# Patient Record
Sex: Male | Born: 1945 | Hispanic: Yes | Marital: Married | Smoking: Never smoker
Health system: Southern US, Community
[De-identification: ages and names within clinical notes are randomized; demographics above are authoritative.]

## PROBLEM LIST (undated history)

## (undated) DIAGNOSIS — I10 Essential (primary) hypertension: Secondary | ICD-10-CM

---

## 2021-02-20 LAB — HEPATIC FUNCTION PANEL
ALT: 12 U/L (ref 10–40)
AST: 15 (ref 14–40)
Alkaline Phosphatase: 49 (ref 25–125)

## 2021-02-20 LAB — BASIC METABOLIC PANEL
BUN: 14 (ref 4–21)
Chloride: 107 (ref 99–108)
Creatinine: 1.1 (ref 0.6–1.3)
Glucose: 122
Potassium: 5.1 mEq/L (ref 3.5–5.1)
Sodium: 145 (ref 137–147)

## 2021-02-20 LAB — LIPID PANEL
Cholesterol: 171 (ref 0–200)
HDL: 45 (ref 35–70)
LDL Cholesterol: 96

## 2021-02-20 LAB — CBC AND DIFFERENTIAL
HCT: 45 (ref 41–53)
Hemoglobin: 13.3 — AB (ref 13.5–17.5)
Platelets: 319 10*3/uL (ref 150–400)
WBC: 8.6

## 2021-02-20 LAB — COMPREHENSIVE METABOLIC PANEL: Calcium: 9.4 (ref 8.7–10.7)

## 2021-02-20 LAB — CBC: RBC: 5.06 (ref 3.87–5.11)

## 2021-02-20 LAB — MICROALBUMIN, URINE: Microalb, Ur: 2.3

## 2021-02-20 LAB — VITAMIN D 25 HYDROXY (VIT D DEFICIENCY, FRACTURES): Vit D, 25-Hydroxy: 57.1

## 2021-06-10 ENCOUNTER — Encounter: Payer: Self-pay | Admitting: Emergency Medicine

## 2021-06-10 ENCOUNTER — Other Ambulatory Visit: Payer: Self-pay

## 2021-06-10 ENCOUNTER — Ambulatory Visit
Admission: EM | Admit: 2021-06-10 | Discharge: 2021-06-10 | Disposition: A | Discharge status: Home / Self Care | Payer: Medicare Other

## 2021-06-10 DIAGNOSIS — S61211A Laceration without foreign body of left index finger without damage to nail, initial encounter: Secondary | ICD-10-CM | POA: Diagnosis not present

## 2021-06-10 DIAGNOSIS — Z23 Encounter for immunization: Secondary | ICD-10-CM

## 2021-06-10 HISTORY — DX: Essential (primary) hypertension: I10

## 2021-06-10 MED ORDER — TETANUS-DIPHTH-ACELL PERTUSSIS 5-2.5-18.5 LF-MCG/0.5 IM SUSY
0.5000 mL | PREFILLED_SYRINGE | Freq: Once | INTRAMUSCULAR | Status: AC
  Administered 2021-06-10: 0.5 mL via INTRAMUSCULAR

## 2021-06-10 MED ORDER — CEPHALEXIN 500 MG PO CAPS: 500.0000 mg | ORAL_CAPSULE | Freq: Three times a day (TID) | ORAL | 0 refills | Status: AC

## 2021-06-10 NOTE — ED Provider Notes (Signed)
[facility]    CSN: [id number] Arrival date & time: 06/10/21  1811      History   Chief Complaint Chief Complaint  Patient presents with   Laceration    HPI Noah Ayala is a 75 y.o. male.   HPI  75 year old male here for evaluation of left finger laceration.  Patient reports that he was preparing dinner and the knife slipped causing him to lacerate his left index finger.  He states he was going to put a bandage on it but did not have one at the house and his family told him he would probably need sutures.  The bleeding is well controlled with pressure and patient has the wound wrapped in a paper towel.  Patient denies numbness or tingling of his finger and he has full movement.  Past Medical History:  Diagnosis Date   Hypertension     There are no problems to display for this patient.   History reviewed. No pertinent surgical history.     Home Medications    Prior to Admission medications   Medication Sig Start Date End Date Taking? Authorizing Provider  cephALEXin (KEFLEX) 500 MG capsule Take 1 capsule (500 mg total) by mouth 3 (three) times daily for 7 days. 06/10/21 06/17/21 Yes Becky Augusta, NP  losartan (COZAAR) 50 MG tablet Take 50 mg by mouth daily. 01/18/21  Yes [provider]  simvastatin (ZOCOR) 40 MG tablet Take 40 mg by mouth at bedtime. 01/25/21  Yes [provider]    Family History No family history on file.  Social History Social History   Tobacco Use   Smoking status: Never   Smokeless tobacco: Never  Vaping Use   Vaping Use: Never used  Substance Use Topics   Alcohol use: Not Currently   Drug use: Not Currently     Allergies   Patient has no known allergies.   Review of Systems Review of Systems  Constitutional:  Negative for activity change, appetite change and fever.  Skin:  Positive for wound.  Neurological:  Negative for weakness and numbness.  Hematological: Negative.   Psychiatric/Behavioral:  Negative.      Physical Exam Triage Vital Signs ED Triage Vitals  Enc Vitals Group     BP      Pulse      Resp      Temp      Temp src      SpO2      Weight      Height      Head Circumference      Peak Flow      Pain Score      Pain Loc      Pain Edu?      Excl. in GC?    No data found.  Updated Vital Signs BP (!) 160/86 (BP Location: Left Arm)   Pulse (!) 58   Temp 98.3 F (36.8 C) (Oral)   Resp 18   Ht 5\' 8"  (1.727 m)   Wt 200 lb (90.7 kg)   SpO2 99%   BMI 30.41 kg/m   Visual Acuity Right Eye Distance:   Left Eye Distance:   Bilateral Distance:    Right Eye Near:   Left Eye Near:    Bilateral Near:     Physical Exam Vitals and nursing note reviewed.  Constitutional:      General: He is not in acute distress.    Appearance: Normal appearance. He is normal weight. He is  not ill-appearing.  HENT:     Head: Normocephalic and atraumatic.  Musculoskeletal:        General: Signs of injury present. No swelling, tenderness or deformity. Normal range of motion.  Skin:    General: Skin is warm and dry.     Capillary Refill: Capillary refill takes less than 2 seconds.     Findings: No bruising or erythema.  Neurological:     General: No focal deficit present.     Mental Status: He is alert and oriented to person, place, and time.     Sensory: No sensory deficit.     Motor: No weakness.  Psychiatric:        Mood and Affect: Mood normal.        Behavior: Behavior normal.        Thought Content: Thought content normal.        Judgment: Judgment normal.     UC Treatments / Results  Labs (all labs ordered are listed, but only abnormal results are displayed) Labs Reviewed - No data to display  EKG   Radiology No results found.  Procedures Laceration Repair  Date/Time: 06/10/2021 7:37 PM Performed by: Becky Augusta, NP Authorized by: Becky Augusta, NP   Consent:    Consent obtained:  Verbal   Consent given by:  Patient   Risks discussed:   Infection, pain and poor wound healing   Alternatives discussed:  No treatment Universal protocol:    Patient identity confirmed:  Verbally with patient Anesthesia:    Anesthesia method:  Local infiltration   Local anesthetic:  Lidocaine 1% w/o epi (3 mL) Laceration details:    Location:  Finger   Finger location:  L index finger   Length (cm):  3   Depth (mm):  5 Pre-procedure details:    Preparation:  Patient was prepped and draped in usual sterile fashion Exploration:    Limited defect created (wound extended): no     Hemostasis achieved with:  Direct pressure   Wound extent: areolar tissue violated     Wound extent: no fascia violation noted, no foreign bodies/material noted, no muscle damage noted, no nerve damage noted and no tendon damage noted     Contaminated: no   Treatment:    Area cleansed with:  Chlorhexidine   Amount of cleaning:  Standard   Debridement:  None   Undermining:  None   Scar revision: no   Skin repair:    Repair method:  Sutures   Suture size:  4-0   Suture material:  Prolene   Suture technique:  Simple interrupted   Number of sutures:  3 Approximation:    Approximation:  Close Repair type:    Repair type:  Simple Post-procedure details:    Dressing:  Antibiotic ointment and non-adherent dressing   Procedure completion:  Tolerated well, no immediate complications Comments:     3 cm laceration to middle phalanx of left index finger anesthetized using 3 mL of 1% lidocaine without epi.  The wound was explored through the steps for presence of foreign body or evidence of tendon or muscle damage.  The laceration does not extend past the subcutaneous layer.  Wound cleansed with chlorhexidine and saline, draped in a sterile fashion, and 3 simple interrupted 4-0 Prolene sutures were placed to close the wound.  Patient tolerated the procedure well.  The wound was again cleansed with saline before a dressing of bacitracin and nonadherent pad applied and  secured in place with Coban.  Patient tolerated procedure well. (including critical care time)  Medications Ordered in UC Medications  Tdap (BOOSTRIX) injection 0.5 mL (0.5 mLs Intramuscular Given 06/10/21 1935)    Initial Impression / Assessment and Plan / UC Course  I have reviewed the triage vital signs and the nursing notes.  Pertinent labs & imaging results that were available during my care of the patient were reviewed by me and considered in my medical decision making (see chart for details).  Patient is a very pleasant, nontoxic-appearing 75 year old male here for evaluation of a laceration to his left index finger.  He reports that he was cooking dinner and then the knife he was using slipped causing him to lacerate his finger.  Patient presents with a 3 cm laceration to the lateral aspect of the middle phalanx of the left index finger.  The bleeding is controlled with pressure.  The patient is full range of motion and sensation of his finger.  Wound explored and was found that extends into the subcutaneous layer but not down to the muscle or tendon layer.  Plan to close with sutures and discharge patient home on Keflex 500 mg 3 times daily for 7 days.  Suture removal in 10 days.  Final Clinical Impressions(s) / UC Diagnoses   Final diagnoses:  Laceration of left index finger without foreign body without damage to nail, initial encounter     Discharge Instructions      Leave the dressing in place for the next 24 hours.  After 24 hours remove the dressing, wash the wound with warm water and soap, pat it dry, and apply a thin smear of bacitracin.  Clean the wound daily and apply bacitracin for the first 2 days.  After that a scab should have started to form and you can leave the wound open to air when you are at home and cover with a Band-Aid when you go out in public.  Take the Keflex three times daily for 7 days for prevention of wound infection.  Sutures remain in place for 10  days, please return here or see your primary care provider for removal.  If you develop any redness at the wound site, swelling, pain, drainage, red streaks going up your hand, or fever please return for reevaluation.      ED Prescriptions     Medication Sig Dispense Auth. Provider   cephALEXin (KEFLEX) 500 MG capsule Take 1 capsule (500 mg total) by mouth 3 (three) times daily for 7 days. 21 capsule Becky Augusta, NP      PDMP not reviewed this encounter.   Becky Augusta, NP 06/10/21 1940

## 2021-06-10 NOTE — ED Triage Notes (Signed)
Pt has laceration on left index finger. He states he cut it cooking about an hour ago. Bleeding controlled.

## 2021-06-10 NOTE — Discharge Instructions (Addendum)
Leave the dressing in place for the next 24 hours.  After 24 hours remove the dressing, wash the wound with warm water and soap, pat it dry, and apply a thin smear of bacitracin.  Clean the wound daily and apply bacitracin for the first 2 days.  After that a scab should have started to form and you can leave the wound open to air when you are at home and cover with a Band-Aid when you go out in public.  Take the Keflex three times daily for 7 days for prevention of wound infection.  Sutures remain in place for 10 days, please return here or see your primary care provider for removal.  If you develop any redness at the wound site, swelling, pain, drainage, red streaks going up your hand, or fever please return for reevaluation.  

## 2021-12-12 ENCOUNTER — Ambulatory Visit: Payer: Medicare Other | Admitting: Urology

## 2021-12-18 ENCOUNTER — Other Ambulatory Visit: Payer: Self-pay

## 2021-12-18 ENCOUNTER — Encounter: Payer: Self-pay | Admitting: Urology

## 2021-12-18 ENCOUNTER — Ambulatory Visit (INDEPENDENT_AMBULATORY_CARE_PROVIDER_SITE_OTHER): Payer: Medicare Other | Admitting: Urology

## 2021-12-18 VITALS — BP 123/76 | HR 82 | Ht 68.0 in | Wt 200.0 lb

## 2021-12-18 DIAGNOSIS — R972 Elevated prostate specific antigen [PSA]: Secondary | ICD-10-CM

## 2021-12-18 DIAGNOSIS — R310 Gross hematuria: Secondary | ICD-10-CM | POA: Diagnosis not present

## 2021-12-18 DIAGNOSIS — N400 Enlarged prostate without lower urinary tract symptoms: Secondary | ICD-10-CM | POA: Diagnosis not present

## 2021-12-18 LAB — MICROSCOPIC EXAMINATION: Bacteria, UA: NONE SEEN

## 2021-12-18 LAB — URINALYSIS, COMPLETE
Bilirubin, UA: NEGATIVE
Glucose, UA: NEGATIVE
Leukocytes,UA: NEGATIVE
Nitrite, UA: NEGATIVE
Protein,UA: NEGATIVE
RBC, UA: NEGATIVE
Specific Gravity, UA: >1.03 — ABNORMAL HIGH (ref 1.005–1.030)
Urobilinogen, Ur: 1 mg/dL (ref 0.2–1.0)
pH, UA: 6 (ref 5.0–7.5)

## 2021-12-18 LAB — BLADDER SCAN AMB NON-IMAGING: Scan Result: 14

## 2021-12-18 NOTE — Progress Notes (Signed)
? ?  12/18/2021 ?9:52 AM  ? ?Noah Ayala ?June 23, 1946 ?[id number] ? ?Referring provider: No referring provider defined for this encounter. ? ?Chief Complaint  ?Patient presents with  ? Hematuria  ? ? ?HPI: ?Noah Ayala is a 76 y.o. male who presents to establish local urologic care. ? ?Recently moved to the area from [place] ?3 weeks ago onset of total gross painless hematuria with significant clot ?Episode lasted a few days and resolved after he stopped taking Naprosyn ?Had no dysuria or flank/abdominal/pelvic pain ? ?Prior urologic records [facility] were reviewed: ? ?Was followed for BPH, elevated PSA and gross hematuria ?Gross hematuria evaluations 2014, 2017 and 2018 showed no significant abnormalities.  Bladder biopsy in 2017 which was negative ?Elevated PSA since 2015 has fluctuated from 3.6-6.0; benign prostate biopsy 2015 for PSA 5.9.  PSA April 2022 was 30 but on repeat back to baseline at 5.1 ?Has previously been on finasteride and alfuzosin but currently on no medications for BPH.  Presently without bothersome LUTS ? ? ?PMH: ?Past Medical History:  ?Diagnosis Date  ? Hypertension   ? ? ?Surgical History: ?History reviewed. No pertinent surgical history. ? ?Home Medications:  ?Allergies as of 12/18/2021   ?No Known Allergies ?  ? ?  ?Medication List  ?  ? ?  ? Accurate as of December 18, 2021  9:52 AM. If you have any questions, ask your nurse or doctor.  ?  ?  ? ?  ? ?losartan 50 MG tablet ?Commonly known as: COZAAR ?Take 50 mg by mouth daily. ?  ?simvastatin 40 MG tablet ?Commonly known as: ZOCOR ?Take 40 mg by mouth at bedtime. ?  ? ?  ? ? ?Allergies: No Known Allergies ? ?Family History: ?History reviewed. No pertinent family history. ? ?Social History:  reports that he has never smoked. He has never used smokeless tobacco. He reports that he does not currently use alcohol. He reports that he does not currently use drugs. ? ? ?Physical Exam: ?BP 123/76   Pulse 82   Ht 5\' 8"  (1.727 m)   Wt 200 lb (90.7 kg)    BMI 30.41 kg/m?   ?Constitutional:  Alert and oriented, No acute distress. ?HEENT: [place] AT, moist mucus membranes.  Trachea midline, no masses. ?Cardiovascular: No clubbing, cyanosis, or edema. ?Respiratory: Normal respiratory effort, no increased work of breathing. ?GI: Abdomen is soft, nontender, nondistended, no abdominal masses ?GU: Prostate 50 g, smooth without nodules ?Skin: No rashes, bruises or suspicious lesions. ?Neurologic: Grossly intact, no focal deficits, moving all 4 extremities. ?Psychiatric: Normal mood and affect. ? ?Laboratory Data: ? ?Urinalysis ?Microscopy negative WBC/RBC; + calcium oxalate crystals ? ? ? ?Assessment & Plan:   ? ?1. Gross hematuria ?Last evaluation in 2018 ?Recent episode of significant gross hematuria with clots ?Recommend reevaluation with CT urogram/cystoscopy ?Creatinine drawn ? ?2.  Elevated PSA ?PSA drawn today ? ?3.  BPH with LUTS ?Stable ? ? ?2019, MD ? ?[facility] ?[address] ?[place], [place] ?[phone] ? ?

## 2021-12-19 LAB — CREATININE, SERUM
Creatinine, Ser: 0.95 mg/dL (ref 0.76–1.27)
eGFR: 83 mL/min/{1.73_m2} (ref >59)

## 2021-12-19 LAB — PSA: Prostate Specific Ag, Serum: 6.2 ng/mL — ABNORMAL HIGH (ref 0.0–4.0)

## 2021-12-20 ENCOUNTER — Telehealth: Payer: Self-pay | Admitting: Family Medicine

## 2021-12-20 NOTE — Telephone Encounter (Signed)
LMOM informed patient of lab result.  

## 2021-12-20 NOTE — Telephone Encounter (Signed)
-----   Message from Abbie Sons, MD sent at 12/19/2021  8:13 PM EDT ----- ?PSA stable at 6.2 ?

## 2021-12-22 ENCOUNTER — Encounter: Payer: Self-pay | Admitting: Urology

## 2022-01-01 ENCOUNTER — Ambulatory Visit
Admission: RE | Admit: 2022-01-01 | Discharge: 2022-01-01 | Disposition: A | Discharge status: Home / Self Care | Payer: Medicare Other | Source: Ambulatory Visit | Attending: Urology | Admitting: Urology

## 2022-01-01 DIAGNOSIS — R310 Gross hematuria: Secondary | ICD-10-CM | POA: Diagnosis present

## 2022-01-01 MED ORDER — IOHEXOL 300 MG/ML  SOLN
100.0000 mL | Freq: Once | INTRAMUSCULAR | Status: AC | PRN
  Administered 2022-01-01: 100 mL via INTRAVENOUS

## 2022-01-06 ENCOUNTER — Encounter: Payer: Self-pay | Admitting: *Deleted

## 2022-01-22 ENCOUNTER — Ambulatory Visit (INDEPENDENT_AMBULATORY_CARE_PROVIDER_SITE_OTHER): Payer: Medicare Other | Admitting: Urology

## 2022-01-22 ENCOUNTER — Encounter: Payer: Self-pay | Admitting: Urology

## 2022-01-22 VITALS — BP 145/81 | HR 74 | Ht 68.0 in | Wt 200.0 lb

## 2022-01-22 DIAGNOSIS — N200 Calculus of kidney: Secondary | ICD-10-CM | POA: Diagnosis not present

## 2022-01-22 DIAGNOSIS — N400 Enlarged prostate without lower urinary tract symptoms: Secondary | ICD-10-CM

## 2022-01-22 DIAGNOSIS — R310 Gross hematuria: Secondary | ICD-10-CM | POA: Diagnosis not present

## 2022-01-22 LAB — URINALYSIS, COMPLETE
Bilirubin, UA: NEGATIVE
Glucose, UA: NEGATIVE
Ketones, UA: NEGATIVE
Leukocytes,UA: NEGATIVE
Nitrite, UA: NEGATIVE
Protein,UA: NEGATIVE
RBC, UA: NEGATIVE
Specific Gravity, UA: 1.025 (ref 1.005–1.030)
Urobilinogen, Ur: 0.2 mg/dL (ref 0.2–1.0)
pH, UA: 6.5 (ref 5.0–7.5)

## 2022-01-22 LAB — MICROSCOPIC EXAMINATION: Bacteria, UA: NONE SEEN

## 2022-01-22 MED ORDER — FINASTERIDE 5 MG PO TABS: 5.0000 mg | ORAL_TABLET | Freq: Every day | ORAL | 3 refills | Status: DC

## 2022-01-22 MED ORDER — SILDENAFIL CITRATE 20 MG PO TABS: ORAL_TABLET | ORAL | 3 refills | Status: DC

## 2022-01-22 NOTE — Progress Notes (Signed)
? ?  01/22/22 ? ?CC:  ?Chief Complaint  ?Patient presents with  ? Cysto  ? ?Indications: Gross hematuria ? ? ?HPI: Has noted minimal amounts of gross hematuria since last visit.  CTU showed a 5 mm nonobstructing left renal calculus.  Prostate volume 124 cc ? ?Blood pressure (!) 145/81, pulse 74, height 5\' 8"  (1.727 m), weight 200 lb (90.7 kg). ?NED. A&Ox3.   ?No respiratory distress   ?Abd soft, NT, ND ?Normal phallus with bilateral descended testicles ? ?Cystoscopy Procedure Note ? ?Patient identification was confirmed, informed consent was obtained, and patient was prepped using Betadine solution.  Lidocaine jelly was administered per urethral meatus.   ? ? ?Pre-Procedure: ?- Inspection reveals a normal caliber urethral meatus. ? ?Procedure: ?The flexible cystoscope was introduced without difficulty ?- No urethral strictures/lesions are present. ?-  Coapting lateral lobes with prominent hypervascularity/friability   ?- Elevated bladder neck ?- Bilateral ureteral orifices identified ?- Bladder mucosa  reveals no ulcers, tumors, or lesions ?- No bladder stones ?-Moderate trabeculation ? ?Retroflexion shows backbleeding from prostate.  No intravesical median lobe or lesions ? ? ?Post-Procedure: ?- Patient tolerated the procedure well ? ?Assessment/ Plan: ?Marked BPH with prostate volume >100 cc ?Hematuria most likely prostatic in origin ?Start finasteride 5 mg daily-potential side effects were discussed including low incidence ED, decreased semen volume and finasteride syndrome ?Nonobstructing left renal calculus ?6 month follow-up visit ? ? ? ? , MD ? ?

## 2022-01-23 LAB — CYTOLOGY - NON PAP

## 2022-01-24 ENCOUNTER — Encounter: Payer: Self-pay | Admitting: *Deleted

## 2022-02-16 ENCOUNTER — Other Ambulatory Visit: Payer: Self-pay | Admitting: Urology

## 2022-04-03 ENCOUNTER — Encounter: Payer: Self-pay | Admitting: Family Medicine

## 2022-04-03 ENCOUNTER — Ambulatory Visit (INDEPENDENT_AMBULATORY_CARE_PROVIDER_SITE_OTHER): Payer: Medicare Other | Admitting: Family Medicine

## 2022-04-03 VITALS — BP 130/80 | HR 72 | Ht 68.0 in | Wt 207.0 lb

## 2022-04-03 DIAGNOSIS — I10 Essential (primary) hypertension: Secondary | ICD-10-CM

## 2022-04-03 DIAGNOSIS — R0789 Other chest pain: Secondary | ICD-10-CM

## 2022-04-03 DIAGNOSIS — E785 Hyperlipidemia, unspecified: Secondary | ICD-10-CM

## 2022-04-03 DIAGNOSIS — R21 Rash and other nonspecific skin eruption: Secondary | ICD-10-CM

## 2022-04-03 MED ORDER — SIMVASTATIN 40 MG PO TABS: 40.0000 mg | ORAL_TABLET | Freq: Every day | ORAL | 1 refills | Status: DC

## 2022-04-03 MED ORDER — LOSARTAN POTASSIUM 50 MG PO TABS: 50.0000 mg | ORAL_TABLET | Freq: Every day | ORAL | 1 refills | Status: DC

## 2022-04-03 NOTE — Progress Notes (Addendum)
Date:  04/03/2022   Name:  Noah Ayala   DOB:  [birth year]   MRN:  [id number]   Chief Complaint: Establish Care and eye redness (Skin changes- brown, irregular circles on R) shoulder and across chest)  Patient is a 76 year old male who presents for a establish care exam. The patient reports the following problems: hypertension/hyperlipidemia. Health maintenance has been reviewed up to date.    Hypertension This is a chronic problem. The current episode started more than 1 year ago. The problem has been gradually improving since onset. The problem is controlled. Pertinent negatives include no anxiety, blurred vision, chest pain, headaches, malaise/fatigue, neck pain, orthopnea, palpitations, peripheral edema, PND, shortness of breath or sweats. There are no associated agents to hypertension. There are no known risk factors for coronary artery disease. Past treatments include angiotensin blockers. The current treatment provides moderate improvement. There are no compliance problems.  There is no history of angina, kidney disease, CAD/MI, CVA, heart failure, left ventricular hypertrophy, PVD or retinopathy. There is no history of chronic renal disease, a hypertension causing med or renovascular disease.  Hyperlipidemia This is a chronic problem. The current episode started more than 1 year ago. The problem is controlled. Recent lipid tests were reviewed and are normal. He has no history of chronic renal disease. Pertinent negatives include no chest pain, myalgias or shortness of breath. Current antihyperlipidemic treatment includes statins.    Lab Results  Component Value Date   CREATININE 0.95 12/18/2021   EGFR 83 12/18/2021   No results found for: "CHOL", "HDL", "LDLCALC", "LDLDIRECT", "TRIG", "CHOLHDL" No results found for: "TSH" No results found for: "HGBA1C" No results found for: "WBC", "HGB", "HCT", "MCV", "PLT" No results found for: "ALT", "AST", "GGT", "ALKPHOS", "BILITOT" No  results found for: "25OHVITD2", "25OHVITD3", "VD25OH"   Review of Systems  Constitutional:  Negative for chills, fever and malaise/fatigue.  HENT:  Negative for drooling, ear discharge, ear pain and sore throat.   Eyes:  Negative for blurred vision.  Respiratory:  Negative for cough, shortness of breath and wheezing.   Cardiovascular:  Negative for chest pain, palpitations, orthopnea, leg swelling and PND.  Gastrointestinal:  Negative for abdominal pain, blood in stool, constipation, diarrhea and nausea.  Endocrine: Negative for polydipsia.  Genitourinary:  Negative for dysuria, frequency, hematuria and urgency.  Musculoskeletal:  Negative for back pain, myalgias and neck pain.  Skin:  Negative for rash.  Allergic/Immunologic: Negative for environmental allergies.  Neurological:  Negative for dizziness and headaches.  Hematological:  Does not bruise/bleed easily.  Psychiatric/Behavioral:  Negative for suicidal ideas. The patient is not nervous/anxious.     There are no problems to display for this patient.   No Known Allergies  No past surgical history on file.  Social History   Tobacco Use   Smoking status: Never   Smokeless tobacco: Never  Vaping Use   Vaping Use: Never used  Substance Use Topics   Alcohol use: Not Currently   Drug use: Not Currently     Medication list has been reviewed and updated.  Current Meds  Medication Sig   finasteride (PROSCAR) 5 MG tablet TAKE 1 TABLET (5 MG TOTAL) BY MOUTH DAILY.   losartan (COZAAR) 50 MG tablet Take 50 mg by mouth daily.   simvastatin (ZOCOR) 40 MG tablet Take 40 mg by mouth at bedtime. 1/2 tablet daily       04/03/2022    3:40 PM  GAD 7 : Generalized Anxiety Score  Nervous,  Anxious, on Edge 1  Control/stop worrying 0  Worry too much - different things 1  Trouble relaxing 0  Restless 0  Easily annoyed or irritable 1  Afraid - awful might happen 0  Total GAD 7 Score 3  Anxiety Difficulty Not difficult at all        04/03/2022    3:40 PM  Depression screen PHQ 2/9  Decreased Interest 0  Down, Depressed, Hopeless 0  PHQ - 2 Score 0  Altered sleeping 0  Tired, decreased energy 1  Change in appetite 0  Feeling bad or failure about yourself  0  Trouble concentrating 1  Moving slowly or fidgety/restless 0  Suicidal thoughts 0  PHQ-9 Score 2  Difficult doing work/chores Not difficult at all    BP Readings from Last 3 Encounters:  04/03/22 130/80  01/22/22 (!) 145/81  12/18/21 123/76    Physical Exam Vitals and nursing note reviewed.  HENT:     Head: Normocephalic.     Right Ear: Tympanic membrane and external ear normal.     Left Ear: Tympanic membrane and external ear normal.     Nose: Nose normal.     Mouth/Throat:     Mouth: Mucous membranes are moist.     Pharynx: No oropharyngeal exudate or posterior oropharyngeal erythema.  Eyes:     General: No scleral icterus.       Right eye: No discharge.        Left eye: No discharge.     Conjunctiva/sclera: Conjunctivae normal.  Neck:     Thyroid: No thyromegaly.     Vascular: No JVD.     Trachea: No tracheal deviation.  Cardiovascular:     Rate and Rhythm: Normal rate and regular rhythm.     Heart sounds: Normal heart sounds. No murmur heard.    No friction rub. No gallop.  Pulmonary:     Effort: No respiratory distress.     Breath sounds: Normal breath sounds. No wheezing, rhonchi or rales.  Chest:     Chest wall: No tenderness.  Abdominal:     General: Bowel sounds are normal.     Palpations: Abdomen is soft. There is no mass.     Tenderness: There is no abdominal tenderness. There is no guarding or rebound.  Musculoskeletal:        General: No tenderness. Normal range of motion.     Cervical back: Normal range of motion and neck supple.  Lymphadenopathy:     Cervical: No cervical adenopathy.  Skin:    General: Skin is warm.     Findings: No rash.  Neurological:     Mental Status: He is alert.     Deep Tendon  Reflexes: Reflexes are normal and symmetric.     Wt Readings from Last 3 Encounters:  04/03/22 207 lb (93.9 kg)  01/22/22 200 lb (90.7 kg)  12/18/21 200 lb (90.7 kg)    BP 130/80   Pulse 72   Ht $R'5\' 8"'eG$  (1.727 m)   Wt 207 lb (93.9 kg)   BMI 31.47 kg/m   Assessment and Plan: 1. Primary hypertension Chronic.  Controlled.  Stable.  Continue losartan 50 mg once a day.  We will recheck in 6 months - losartan (COZAAR) 50 MG tablet; Take 1 tablet (50 mg total) by mouth daily.  Dispense: 90 tablet; Refill: 1  2. Hyperlipidemia, unspecified hyperlipidemia type Chronic.  Controlled.  Stable.  Continue simvastatin 40 mg nightly 1/2 tablet. - simvastatin (ZOCOR)  40 MG tablet; Take 1 tablet (40 mg total) by mouth at bedtime. 1/2 tablet daily  Dispense: 45 tablet; Refill: 1  3. Rash New onset.  Persistent.  Stable.  Patient has a rash that I cannot really appreciate it appears to be tinea versicolor but will refer to dermatology because he also has what looks like a seborrheic keratoses on the scalp. - Ambulatory referral to Dermatology  4. Nonexertional chest pain Relatively new onset over several months patient has been having substernal discomfort but of a nonexertional nature that we will occur when he is lying supine at rest.  Patient has been able to walk and there is no pain associated with this however we need to do further evaluation given his risk factors and age.  Patient will return next week and we will reevaluate this discomfort.  Patient has been instructed that if substernal chest pain should return if does not resolve he is to go to the emergency room for further evaluation.  Patient establishing care with new physician.  Had 2 new concerns along with ongoing chronic concerns of hypertension and hyperlipidemia.

## 2022-04-08 ENCOUNTER — Ambulatory Visit (INDEPENDENT_AMBULATORY_CARE_PROVIDER_SITE_OTHER): Payer: Medicare Other | Admitting: Family Medicine

## 2022-04-08 ENCOUNTER — Encounter: Payer: Self-pay | Admitting: Family Medicine

## 2022-04-08 ENCOUNTER — Telehealth: Payer: Self-pay | Admitting: Family Medicine

## 2022-04-08 VITALS — BP 130/76 | HR 60 | Ht 68.0 in | Wt 207.0 lb

## 2022-04-08 DIAGNOSIS — I10 Essential (primary) hypertension: Secondary | ICD-10-CM | POA: Diagnosis not present

## 2022-04-08 DIAGNOSIS — R0789 Other chest pain: Secondary | ICD-10-CM | POA: Diagnosis not present

## 2022-04-08 DIAGNOSIS — E785 Hyperlipidemia, unspecified: Secondary | ICD-10-CM | POA: Diagnosis not present

## 2022-04-08 NOTE — Progress Notes (Signed)
Date:  04/08/2022   Name:  Noah Ayala   DOB:  1945/12/06   MRN:  [id number]   Chief Complaint: Chest Pain (Nonexertional- has not had any episodes since before the last visit)  Chest Pain  This is a new problem. The current episode started more than 1 month ago. The onset quality is gradual. The problem occurs every several days. The pain is present in the substernal region. The pain is at a severity of 3/10. The pain is moderate. The quality of the pain is described as tightness. The pain does not radiate. Pertinent negatives include no abdominal pain, diaphoresis, dizziness, exertional chest pressure, near-syncope, palpitations or shortness of breath. The treatment provided no relief.  His past medical history is significant for hyperlipidemia and hypertension.  His family medical history is significant for CAD and heart disease.    Lab Results  Component Value Date   CREATININE 0.95 12/18/2021   EGFR 83 12/18/2021   No results found for: "CHOL", "HDL", "LDLCALC", "LDLDIRECT", "TRIG", "CHOLHDL" No results found for: "TSH" No results found for: "HGBA1C" No results found for: "WBC", "HGB", "HCT", "MCV", "PLT" No results found for: "ALT", "AST", "GGT", "ALKPHOS", "BILITOT" No results found for: "25OHVITD2", "25OHVITD3", "VD25OH"   Review of Systems  Constitutional:  Negative for diaphoresis.  HENT:  Negative for sinus pressure.   Respiratory:  Negative for shortness of breath.   Cardiovascular:  Positive for chest pain. Negative for palpitations and near-syncope.  Gastrointestinal:  Negative for abdominal pain.  Endocrine: Negative for polydipsia and polyuria.  Genitourinary:  Positive for frequency.  Neurological:  Negative for dizziness.    There are no problems to display for this patient.   No Known Allergies  No past surgical history on file.  Social History   Tobacco Use   Smoking status: Never   Smokeless tobacco: Never  Vaping Use   Vaping Use: Never used   Substance Use Topics   Alcohol use: Not Currently   Drug use: Not Currently     Medication list has been reviewed and updated.  Current Meds  Medication Sig   finasteride (PROSCAR) 5 MG tablet TAKE 1 TABLET (5 MG TOTAL) BY MOUTH DAILY.   losartan (COZAAR) 50 MG tablet Take 1 tablet (50 mg total) by mouth daily.   simvastatin (ZOCOR) 40 MG tablet Take 1 tablet (40 mg total) by mouth at bedtime. 1/2 tablet daily       04/08/2022    1:39 PM 04/03/2022    3:40 PM  GAD 7 : Generalized Anxiety Score  Nervous, Anxious, on Edge 0 1  Control/stop worrying 0 0  Worry too much - different things 0 1  Trouble relaxing 0 0  Restless 1 0  Easily annoyed or irritable 0 1  Afraid - awful might happen 0 0  Total GAD 7 Score 1 3  Anxiety Difficulty Not difficult at all Not difficult at all       04/08/2022    1:39 PM 04/03/2022    3:40 PM  Depression screen PHQ 2/9  Decreased Interest 0 0  Down, Depressed, Hopeless 0 0  PHQ - 2 Score 0 0  Altered sleeping 0 0  Tired, decreased energy 1 1  Change in appetite 0 0  Feeling bad or failure about yourself  0 0  Trouble concentrating 1 1  Moving slowly or fidgety/restless 0 0  Suicidal thoughts 0 0  PHQ-9 Score 2 2  Difficult doing work/chores Not difficult at all  Not difficult at all    BP Readings from Last 3 Encounters:  04/08/22 130/76  04/03/22 130/80  01/22/22 (!) 145/81    Physical Exam Vitals and nursing note reviewed.  HENT:     Head: Normocephalic.     Right Ear: External ear normal.     Left Ear: External ear normal.     Nose: Nose normal.  Eyes:     General: No scleral icterus.       Right eye: No discharge.        Left eye: No discharge.     Conjunctiva/sclera: Conjunctivae normal.     Pupils: Pupils are equal, round, and reactive to light.  Neck:     Thyroid: No thyromegaly.     Vascular: No JVD.     Trachea: No tracheal deviation.  Cardiovascular:     Rate and Rhythm: Regular rhythm. Bradycardia present.      Heart sounds: Normal heart sounds. No murmur heard.    No systolic murmur is present.     No diastolic murmur is present.     No friction rub. No gallop. No S3 or S4 sounds.  Pulmonary:     Effort: No respiratory distress.     Breath sounds: Normal breath sounds. No decreased breath sounds, wheezing, rhonchi or rales.  Abdominal:     General: Bowel sounds are normal.     Palpations: Abdomen is soft. There is no hepatomegaly, splenomegaly or mass.     Tenderness: There is no abdominal tenderness. There is no guarding or rebound.  Musculoskeletal:        General: No tenderness. Normal range of motion.     Cervical back: Normal range of motion and neck Ayala.  Lymphadenopathy:     Cervical: No cervical adenopathy.  Skin:    General: Skin is warm.     Findings: No rash.  Neurological:     Mental Status: He is alert and oriented to person, place, and time.     Cranial Nerves: No cranial nerve deficit.     Deep Tendon Reflexes: Reflexes are normal and symmetric.     Wt Readings from Last 3 Encounters:  04/08/22 207 lb (93.9 kg)  04/03/22 207 lb (93.9 kg)  01/22/22 200 lb (90.7 kg)    BP 130/76   Pulse 60   Ht _0  (1.727 m)   Wt 207 lb (93.9 kg)   BMI 31.47 kg/m   Assessment and Plan:  1. Nonexertional chest pain New onset.  Episodic.  Patient relates nonexertional chest pain but has multiple risk factors including hypertension hyperlipidemia and positive family history.  EKG performed and read as follows: Bradycardia with a rate of 57 sinus rhythm.  Intervals normal no voltage criteria met for for LVH.  No evidence of ischemic changes such as Q waves, ST-T wave changes, or delay in R wave progression.  Given the family history and a previous EKG that was done over 10 years ago in [place] we will refer to cardiology for evaluation with determination whether further investigation such as stress test or cardiac echo may need to be done. - EKG 12-Lead - Ambulatory referral  to Cardiology  2. Primary hypertension Chronic.  Controlled.  Stable.  Currently taking losartan 50 mg once a day.  We will check renal function panel for electrolytes and GFR. - Renal Function Panel - Ambulatory referral to Cardiology  3. Hyperlipidemia, unspecified hyperlipidemia type Chronic.  Controlled.  Stable.  Patient currently taking simvastatin 40  mg once a day we will check for lipid panel for current LDL control. - Lipid Panel With LDL/HDL Ratio - Ambulatory referral to Cardiology   Patient has been instructed if chest pain should resume and remain persistent or other symptoms develop that calling 911 would be in order.

## 2022-04-08 NOTE — Telephone Encounter (Signed)
Copied from CRM [id number]. Topic: Referral - Status >> Apr 08, 2022  3:01 PM Franchot Heidelberg wrote: Reason for CRM: Para March from [facility] dermatology called reporting that they have yet to receive the referral for this patient. Call back request   Best contact: [phone]

## 2022-04-08 NOTE — Telephone Encounter (Signed)
Spoke with Angelique Blonder she reached out to [facility] and resent the referral. Pt is scheduled for August 2 at 3:30.  KP

## 2022-04-08 NOTE — Telephone Encounter (Signed)
Sent message to [facility]. Waiting on response. Referral was placed 04/03/22.  KP

## 2022-04-09 LAB — LIPID PANEL WITH LDL/HDL RATIO
Cholesterol, Total: 178 mg/dL (ref 100–199)
HDL: 47 mg/dL (ref >39)
LDL Chol Calc (NIH): 103 mg/dL — ABNORMAL HIGH (ref 0–99)
LDL/HDL Ratio: 2.2 ratio (ref 0.0–3.6)
Triglycerides: 157 mg/dL — ABNORMAL HIGH (ref 0–149)
VLDL Cholesterol Cal: 28 mg/dL (ref 5–40)

## 2022-04-09 LAB — RENAL FUNCTION PANEL
Albumin: 4.4 g/dL (ref 3.8–4.8)
BUN/Creatinine Ratio: 15 (ref 10–24)
BUN: 13 mg/dL (ref 8–27)
CO2: 25 mmol/L (ref 20–29)
Calcium: 9.3 mg/dL (ref 8.6–10.2)
Chloride: 104 mmol/L (ref 96–106)
Creatinine, Ser: 0.87 mg/dL (ref 0.76–1.27)
Glucose: 91 mg/dL (ref 70–99)
Phosphorus: 3.6 mg/dL (ref 2.8–4.1)
Potassium: 4.8 mmol/L (ref 3.5–5.2)
Sodium: 142 mmol/L (ref 134–144)
eGFR: 89 mL/min/{1.73_m2} (ref >59)

## 2022-05-12 ENCOUNTER — Other Ambulatory Visit: Payer: Self-pay | Admitting: Family Medicine

## 2022-05-12 DIAGNOSIS — I10 Essential (primary) hypertension: Secondary | ICD-10-CM

## 2022-05-13 NOTE — Telephone Encounter (Signed)
Requested medication (s) are due for refill today: no   Requested medication (s) are on the active medication list: yes  Last refill:  04/03/22  Future visit scheduled:yes  Notes to clinic:  Unable to refill per protocol, last refill by provider 04/03/22 for 90 days and 1 RF, request is too soon.     Requested Prescriptions  Pending Prescriptions Disp Refills   losartan (COZAAR) 50 MG tablet [Pharmacy Med Name: LOSARTAN POTASSIUM 50 MG TAB] 90 tablet 1    Sig: TAKE 1 TABLET BY MOUTH 1 TIME EACH DAY.     Cardiovascular:  Angiotensin Receptor Blockers Passed - 05/12/2022  7:26 PM      Passed - Cr in normal range and within 180 days    Creatinine, Ser  Date Value Ref Range Status  04/08/2022 0.87 0.76 - 1.27 mg/dL Final         Passed - K in normal range and within 180 days    Potassium  Date Value Ref Range Status  04/08/2022 4.8 3.5 - 5.2 mmol/L Final         Passed - Patient is not pregnant      Passed - Last BP in normal range    BP Readings from Last 1 Encounters:  04/08/22 130/76         Passed - Valid encounter within last 6 months    Recent Outpatient Visits           1 month ago Nonexertional chest pain   [facility] [facility]   1 month ago Primary hypertension   [place] [facility]       Future Appointments             In 2 months Stoioff, Verna Czech, MD [facility]   In 3 months Duanne Limerick, MD [facility] at [facility], [facility]

## 2022-05-15 ENCOUNTER — Other Ambulatory Visit: Payer: Self-pay | Admitting: Family Medicine

## 2022-05-15 DIAGNOSIS — I10 Essential (primary) hypertension: Secondary | ICD-10-CM

## 2022-05-15 MED ORDER — LOSARTAN POTASSIUM 50 MG PO TABS: 50.0000 mg | ORAL_TABLET | Freq: Every day | ORAL | 0 refills | Status: DC

## 2022-05-15 NOTE — Telephone Encounter (Addendum)
Medication Refill - Medication: losartan (COZAAR) 50 MG tablet Patient is out of this med  Has the patient contacted their pharmacy? Yes was told refill is too soon (Agent: If no, request that the patient contact the pharmacy for the refill. If patient does not wish to contact the pharmacy document the reason why and proceed with request.) (Agent: If yes, when and what did the pharmacy advise?)contact pcp  Preferred Pharmacy (with phone number or street name):  [facility] [phone] Dan Humphre[address], [place] - [address] Phone:  [phone]  Fax:  [phone]     Has the patient been seen for an appointment in the last year OR does the patient have an upcoming appointment? yes  Agent: Please be advised that RX refills may take up to 3 business days. We ask that you follow-up with your pharmacy.

## 2022-05-15 NOTE — Telephone Encounter (Signed)
Resending to pharmacy since last was listed as "no print"  Requested Prescriptions  Pending Prescriptions Disp Refills  . losartan (COZAAR) 50 MG tablet 90 tablet 1    Sig: Take 1 tablet (50 mg total) by mouth daily.     Cardiovascular:  Angiotensin Receptor Blockers Passed - 05/15/2022  3:35 PM      Passed - Cr in normal range and within 180 days    Creatinine, Ser  Date Value Ref Range Status  04/08/2022 0.87 0.76 - 1.27 mg/dL Final         Passed - K in normal range and within 180 days    Potassium  Date Value Ref Range Status  04/08/2022 4.8 3.5 - 5.2 mmol/L Final         Passed - Patient is not pregnant      Passed - Last BP in normal range    BP Readings from Last 1 Encounters:  04/08/22 130/76         Passed - Valid encounter within last 6 months    Recent Outpatient Visits          1 month ago Nonexertional chest pain   [place] [facility]   1 month ago Primary hypertension   [place] [facility]      Future Appointments            In 2 months Stoioff, Verna Czech, MD [facility]   In 2 months Duanne Limerick, MD [facility] at [facility], [facility]

## 2022-05-16 ENCOUNTER — Ambulatory Visit (INDEPENDENT_AMBULATORY_CARE_PROVIDER_SITE_OTHER): Payer: Medicare Other

## 2022-05-16 VITALS — BP 122/76 | HR 58 | Temp 97.9°F | Resp 17 | Ht 68.0 in | Wt 206.0 lb

## 2022-05-16 DIAGNOSIS — Z0000 Encounter for general adult medical examination without abnormal findings: Secondary | ICD-10-CM

## 2022-05-16 NOTE — Patient Instructions (Signed)
Health Maintenance, Male Adopting a healthy lifestyle and getting preventive care are important in promoting health and wellness. Ask your health care provider about: The right schedule for you to have regular tests and exams. Things you can do on your own to prevent diseases and keep yourself healthy. What should I know about diet, weight, and exercise? Eat a healthy diet  Eat a diet that includes plenty of vegetables, fruits, low-fat dairy products, and lean protein. Do not eat a lot of foods that are high in solid fats, added sugars, or sodium. Maintain a healthy weight Body mass index (BMI) is a measurement that can be used to identify possible weight problems. It estimates body fat based on height and weight. Your health care provider can help determine your BMI and help you achieve or maintain a healthy weight. Get regular exercise Get regular exercise. This is one of the most important things you can do for your health. Most adults should: Exercise for at least 150 minutes each week. The exercise should increase your heart rate and make you sweat (moderate-intensity exercise). Do strengthening exercises at least twice a week. This is in addition to the moderate-intensity exercise. Spend less time sitting. Even light physical activity can be beneficial. Watch cholesterol and blood lipids Have your blood tested for lipids and cholesterol at 76 years of age, then have this test every 5 years. You may need to have your cholesterol levels checked more often if: Your lipid or cholesterol levels are high. You are older than 76 years of age. You are at high risk for heart disease. What should I know about cancer screening? Many types of cancers can be detected early and may often be prevented. Depending on your health history and family history, you may need to have cancer screening at various ages. This may include screening for: Colorectal cancer. Prostate cancer. Skin cancer. Lung  cancer. What should I know about heart disease, diabetes, and high blood pressure? Blood pressure and heart disease High blood pressure causes heart disease and increases the risk of stroke. This is more likely to develop in people who have high blood pressure readings or are overweight. Talk with your health care provider about your target blood pressure readings. Have your blood pressure checked: Every 3-5 years if you are 18-39 years of age. Every year if you are 40 years old or older. If you are between the ages of 65 and 75 and are a current or former smoker, ask your health care provider if you should have a one-time screening for abdominal aortic aneurysm (AAA). Diabetes Have regular diabetes screenings. This checks your fasting blood sugar level. Have the screening done: Once every three years after age 45 if you are at a normal weight and have a low risk for diabetes. More often and at a younger age if you are overweight or have a high risk for diabetes. What should I know about preventing infection? Hepatitis B If you have a higher risk for hepatitis B, you should be screened for this virus. Talk with your health care provider to find out if you are at risk for hepatitis B infection. Hepatitis C Blood testing is recommended for: Everyone born from 1945 through 1965. Anyone with known risk factors for hepatitis C. Sexually transmitted infections (STIs) You should be screened each year for STIs, including gonorrhea and chlamydia, if: You are sexually active and are younger than 76 years of age. You are older than 76 years of age and your   health care provider tells you that you are at risk for this type of infection. Your sexual activity has changed since you were last screened, and you are at increased risk for chlamydia or gonorrhea. Ask your health care provider if you are at risk. Ask your health care provider about whether you are at high risk for HIV. Your health care provider  may recommend a prescription medicine to help prevent HIV infection. If you choose to take medicine to prevent HIV, you should first get tested for HIV. You should then be tested every 3 months for as long as you are taking the medicine. Follow these instructions at home: Alcohol use Do not drink alcohol if your health care provider tells you not to drink. If you drink alcohol: Limit how much you have to 0-2 drinks a day. Know how much alcohol is in your drink. In the [place] one drink equals one 12 oz bottle of beer (355 mL), one 5 oz glass of wine (148 mL), or one 1 oz glass of hard liquor (44 mL). Lifestyle Do not use any products that contain nicotine or tobacco. These products include cigarettes, chewing tobacco, and vaping devices, such as e-cigarettes. If you need help quitting, ask your health care provider. Do not use street drugs. Do not share needles. Ask your health care provider for help if you need support or information about quitting drugs. General instructions Schedule regular health, dental, and eye exams. Stay current with your vaccines. Tell your health care provider if: You often feel depressed. You have ever been abused or do not feel safe at home. Summary Adopting a healthy lifestyle and getting preventive care are important in promoting health and wellness. Follow your health care provider's instructions about healthy diet, exercising, and getting tested or screened for diseases. Follow your health care provider's instructions on monitoring your cholesterol and blood pressure. This information is not intended to replace advice given to you by your health care provider. Make sure you discuss any questions you have with your health care provider. Document Revised: 01/28/2021 Document Reviewed: 01/28/2021 Elsevier Patient Education  2023 [employer]  

## 2022-05-16 NOTE — Progress Notes (Signed)
Subjective:   Noah Ayala is a 76 y.o. male who presents for Medicare Annual/Subsequent preventive examination.  Review of Systems    Per HPI unless specifically indicated below  Cardiac Risk Factors include: advanced age (>5men, >44 women);male gender          Objective:    Today's Vitals   05/16/22 1114 05/16/22 1150  BP: 122/76   Pulse: (!) 58   Resp: 17   Temp: 97.9 F (36.6 C)   TempSrc: Oral   SpO2: 98%   Weight: 206 lb (93.4 kg)   Height: 5\' 8"  (1.727 m)   PainSc: 0-No pain 0-No pain   Body mass index is 31.32 kg/m.     06/10/2021    7:14 PM  Advanced Directives  Does Patient Have a Medical Advance Directive? No    Current Medications (verified) Outpatient Encounter Medications as of 05/16/2022  Medication Sig   cholecalciferol (VITAMIN D3) 25 MCG (1000 UNIT) tablet Take 5,000 Units by mouth daily.   finasteride (PROSCAR) 5 MG tablet TAKE 1 TABLET (5 MG TOTAL) BY MOUTH DAILY.   losartan (COZAAR) 50 MG tablet Take 1 tablet (50 mg total) by mouth daily.   simvastatin (ZOCOR) 40 MG tablet Take 1 tablet (40 mg total) by mouth at bedtime. 1/2 tablet daily   No facility-administered encounter medications on file as of 05/16/2022.    Allergies (verified) Patient has no known allergies.   History: Past Medical History:  Diagnosis Date   Hypertension    No past surgical history on file. No family history on file. Social History   Socioeconomic History   Marital status: Married    Spouse name: Crader,DIANE   Number of children: 3   Years of education: Not on file   Highest education level: Not on file  Occupational History   Occupation: Retired  Tobacco Use   Smoking status: Never   Smokeless tobacco: Never  Vaping Use   Vaping Use: Never used  Substance and Sexual Activity   Alcohol use: Not Currently   Drug use: Not Currently   Sexual activity: Yes  Other Topics Concern   Not on file  Social History Narrative   Not on file   Social  Determinants of Health   Financial Resource Strain: Low Risk  (05/16/2022)   Overall Financial Resource Strain (CARDIA)    Difficulty of Paying Living Expenses: Not hard at all  Food Insecurity: No Food Insecurity (05/16/2022)   Hunger Vital Sign    Worried About Running Out of Food in the Last Year: Never true    Ran Out of Food in the Last Year: Never true  Transportation Needs: No Transportation Needs (05/16/2022)   PRAPARE - 05/18/2022 (Medical): No    Lack of Transportation (Non-Medical): No  Physical Activity: Inactive (05/16/2022)   Exercise Vital Sign    Days of Exercise per Week: 0 days    Minutes of Exercise per Session: 0 min  Stress: No Stress Concern Present (05/16/2022)   05/18/2022 of Occupational Health - Occupational Stress Questionnaire    Feeling of Stress : Not at all  Social Connections: Moderately Integrated (05/16/2022)   Social Connection and Isolation Panel [NHANES]    Frequency of Communication with Friends and Family: More than three times a week    Frequency of Social Gatherings with Friends and Family: Three times a week    Attends Religious Services: More than 4 times per year  Active Member of Clubs or Organizations: No    Attends Banker Meetings: Never    Marital Status: Married    Tobacco Counseling No history of smoking   Clinical Intake:  Pre-visit preparation completed: No  Pain : No/denies pain Pain Score: 0-No pain     Nutritional Status: BMI > 30  Obese Nutritional Risks: None Diabetes: No  How often do you need to have someone help you when you read instructions, pamphlets, or other written materials from your doctor or pharmacy?: 1 - Never  Diabetic?No     Information entered by :: Laurel Dimmer, CMA   Activities of Daily Living    05/16/2022   11:24 AM 05/12/2022   10:51 AM  In your present state of health, do you have any difficulty performing the following activities:   Hearing? 1 0  Vision? 1 0  Difficulty concentrating or making decisions? 1 0  Walking or climbing stairs? 0 0  Dressing or bathing? 0 0  Doing errands, shopping? 0 0  Preparing Food and eating ?  N  Using the Toilet?  N  Do you have problems with loss of bowel control?  N  Managing your Medications?  N  Managing your Finances?  N  Housekeeping or managing your Housekeeping?  N    Patient Care Team: Duanne Limerick, MD as PCP - General (Family Medicine)  Indicate any recent Medical Services you may have received from other than [facility] providers in the past year (date may be approximate).    The pt was seen at [facility] on 06/10/2021 for a laceration of the left index finger. Assessment:   This is a routine wellness examination for Rayvion.  Hearing/Vision screen Bilateral mild hearing loss. He reports that he seen a audiologist in the past and was told that he had issues with high pitches. No hearing aids needed at that time.  Annual eye exam done at [facility] [place], [place]. Wear glasses  Dietary issues and exercise activities discussed: Current Exercise Habits: The patient does not participate in regular exercise at present, Exercise limited by: None identified Vegetarian diet   Goals Addressed             This Visit's Progress    Activity and Exercise Increased       Evidence-based guidance:  Review current exercise levels.  Assess patient perspective on exercise or activity level, barriers to increasing activity, motivation and readiness for change.  Recommend or set healthy exercise goal based on individual tolerance.  Encourage small steps toward making change in amount of exercise or activity.  Urge reduction of sedentary activities or screen time.  Promote group activities within the community or with family or support person.  Consider referral to rehabiliation therapist for assessment and exercise/activity plan.   Notes: Pt plans to start walking since the  weather is about to change.        Depression Screen    05/16/2022   11:15 AM 04/08/2022    1:39 PM 04/03/2022    3:40 PM  PHQ 2/9 Scores  PHQ - 2 Score 0 0 0  PHQ- 9 Score 0 2 2    Fall Risk    05/16/2022   11:19 AM 05/12/2022   10:51 AM 04/08/2022    1:39 PM 04/03/2022    3:41 PM  Fall Risk   Falls in the past year? 0 0 0 0  Number falls in past yr: 0 0 0 0  Injury with Fall? 0 0 0 0  Risk for fall due to : No Fall Risks  No Fall Risks No Fall Risks  Follow up Falls evaluation completed  Falls evaluation completed Falls evaluation completed    FALL RISK PREVENTION PERTAINING TO THE HOME:  Any stairs in or around the home? Yes  If so, are there any without handrails? No  Home free of loose throw rugs in walkways, pet beds, electrical cords, etc? Yes  Adequate lighting in your home to reduce risk of falls? Yes   ASSISTIVE DEVICES UTILIZED TO PREVENT FALLS:  Life alert? No  Use of a cane, walker or w/c? No  Grab bars in the bathroom? No  Shower chair or bench in shower? No  Elevated toilet seat or a handicapped toilet? No   TIMED UP AND GO:  Was the test performed? Yes .  Length of time to ambulate 10 feet: 10 sec.   Gait steady and fast without use of assistive device  Cognitive Function:        05/16/2022   11:25 AM  6CIT Screen  What Year? 0 points  What month? 0 points  What time? 0 points  Count back from 20 0 points  Months in reverse 0 points  Repeat phrase 0 points  Total Score 0 points    Immunizations Immunization History  Administered Date(s) Administered   Influenza-Unspecified 06/22/2014, 07/09/2017, 07/21/2020   PFIZER Comirnaty(Gray Top)Covid-19 Tri-Sucrose Vaccine 12/19/2020   PFIZER(Purple Top)SARS-COV-2 Vaccination 10/16/2019, 11/12/2019, 05/09/2020   PNEUMOCOCCAL CONJUGATE-20 01/12/2012   Pneumococcal Conjugate-13 12/15/2013, 06/06/2014, 05/06/2019   Tdap 06/10/2021   Zoster Recombinat (Shingrix) 01/30/2021   Zoster, Live  01/12/2012    TDAP status: Up to date  Flu Vaccine status: Up to date  Pneumococcal vaccine status: Up to date  Covid-19 vaccine status: Completed vaccines  Qualifies for Shingles Vaccine? Yes   Zostavax completed No   Shingrix Completed?: Yes  Screening Tests Health Maintenance  Topic Date Due   COVID-19 Vaccine (5 - Pfizer series) 02/13/2021   INFLUENZA VACCINE  04/22/2022   Zoster Vaccines- Shingrix (2 of 2) 07/04/2022 (Originally 03/27/2021)   Hepatitis C Screening  04/04/2023 (Originally 11/15/1963)   TETANUS/TDAP  06/11/2031   Pneumonia Vaccine 64+ Years old  Completed   HPV VACCINES  Aged Out    Health Maintenance  Health Maintenance Due  Topic Date Due   COVID-19 Vaccine (5 - Pfizer series) 02/13/2021   INFLUENZA VACCINE  04/22/2022    Colorectal cancer screening: No longer required.   Lung Cancer Screening: (Low Dose CT Chest recommended if Age 47-80 years, 30 pack-year currently smoking OR have quit w/in 15years.) does qualify.    Additional Screening:  Hepatitis C Screening: does qualify; Completed   Vision Screening: Recommended annual ophthalmology exams for early detection of glaucoma and other disorders of the eye. Is the patient up to date with their annual eye exam?  Yes  Who is the provider or what is the name of the office in which the patient attends annual eye exams? [facility]  If pt is not established with a provider, would they like to be referred to a provider to establish care? No .   Dental Screening: Recommended annual dental exams for proper oral hygiene  Community Resource Referral / Chronic Care Management: CRR required this visit?  No   CCM required this visit?  No      Plan:     I have personally reviewed and noted the following  in the patient's chart:   Medical and social history Use of alcohol, tobacco or illicit drugs  Current medications and supplements including opioid prescriptions. Patient is not currently taking  opioid prescriptions. Functional ability and status Nutritional status Physical activity Advanced directives List of other physicians Hospitalizations, surgeries, and ER visits in previous 12 months Vitals Screenings to include cognitive, depression, and falls Referrals and appointments  In addition, I have reviewed and discussed with patient certain preventive protocols, quality metrics, and best practice recommendations. A written personalized care plan for preventive services as well as general preventive health recommendations were provided to patient.     Mr. Denne , Thank you for taking time to come for your Medicare Wellness Visit. I appreciate your ongoing commitment to your health goals. Please review the following plan we discussed and let me know if I can assist you in the future.   These are the goals we discussed:  Goals      Activity and Exercise Increased     Evidence-based guidance:  Review current exercise levels.  Assess patient perspective on exercise or activity level, barriers to increasing activity, motivation and readiness for change.  Recommend or set healthy exercise goal based on individual tolerance.  Encourage small steps toward making change in amount of exercise or activity.  Urge reduction of sedentary activities or screen time.  Promote group activities within the community or with family or support person.  Consider referral to rehabiliation therapist for assessment and exercise/activity plan.   Notes: Pt plans to start walking since the weather is about to change.         This is a list of the screening recommended for you and due dates:  Health Maintenance  Topic Date Due   COVID-19 Vaccine (5 - Pfizer series) 02/13/2021   Flu Shot  04/22/2022   Zoster (Shingles) Vaccine (2 of 2) 07/04/2022*   Hepatitis C Screening: USPSTF Recommendation to screen - Ages 18-79 yo.  04/04/2023*   Tetanus Vaccine  06/11/2031   Pneumonia Vaccine  Completed    HPV Vaccine  Aged Out  *Topic was postponed. The date shown is not the original due date.    Lonna Cobb, CMA   05/16/2022   Nurse Notes:  Approximately 40 minute Face-to-face Appt. The pt complained of a episode of vertigo a few weeks go that subsided. He is requesting a referral for PT for the symptoms. I scheduled the patient an appt to address his concerns. Appt scheduled for Monday, August 28th.

## 2022-05-19 ENCOUNTER — Ambulatory Visit (INDEPENDENT_AMBULATORY_CARE_PROVIDER_SITE_OTHER): Payer: Medicare Other | Admitting: Family Medicine

## 2022-05-19 ENCOUNTER — Encounter: Payer: Self-pay | Admitting: Family Medicine

## 2022-05-19 VITALS — BP 124/62 | HR 64 | Ht 68.0 in | Wt 206.0 lb

## 2022-05-19 DIAGNOSIS — H8110 Benign paroxysmal vertigo, unspecified ear: Secondary | ICD-10-CM

## 2022-05-19 IMAGING — CT CT ABD-PEL WO/W CM
3 of 12 series · 12 of 46 positions shown, 18 images · IV contrast (agent unspecified)
Comparison: None.

CLINICAL DATA: Hematuria

EXAM:
CT ABDOMEN AND PELVIS WITHOUT AND WITH CONTRAST
TECHNIQUE: Multidetector CT imaging of the abdomen and pelvis was performed
following the standard protocol before and following the bolus
administration of intravenous contrast.

[Series 2: axial pre · axial · non-contrast · 0.88mm/px · z∈[-84,+56]mm · 3 of 99 slices shown]
[im 15/99  soft-tissue]
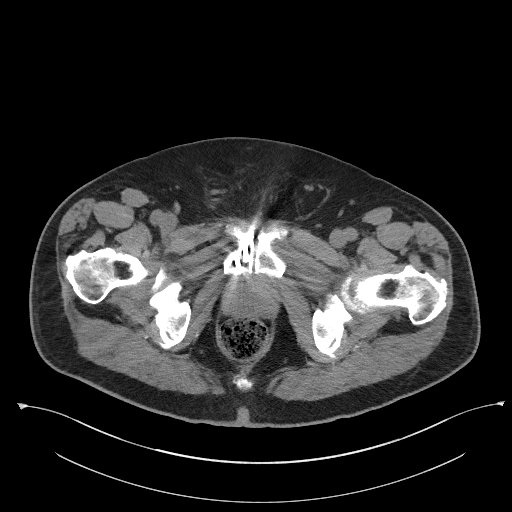
[im 29/99  soft-tissue]
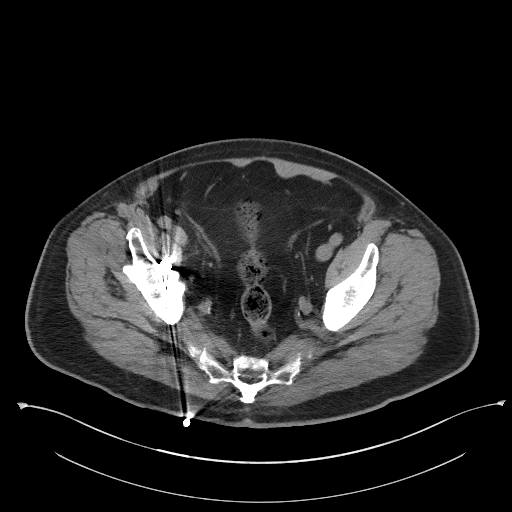
[im 43/99  soft-tissue]
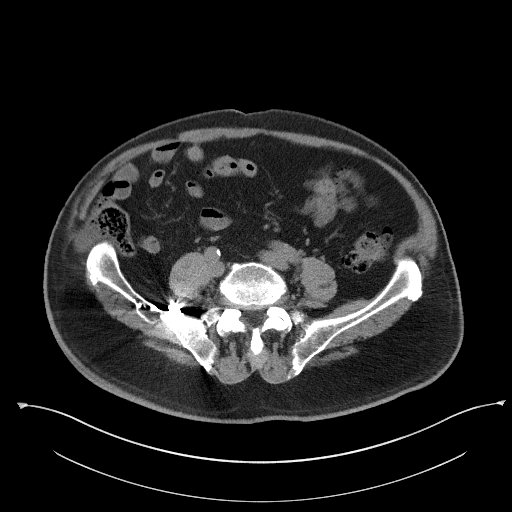

[Series 13: axial delay · axial · delayed · 0.98mm/px · z∈[-138,+252]mm · 7 of 104 slices shown, 12 images]
[im 13/104  soft-tissue]
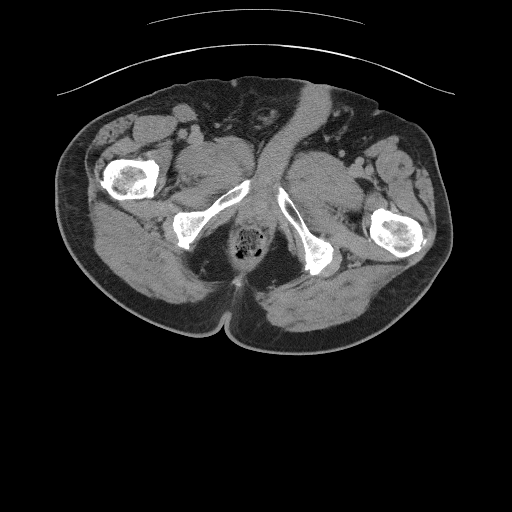
[im 13/104  bone]
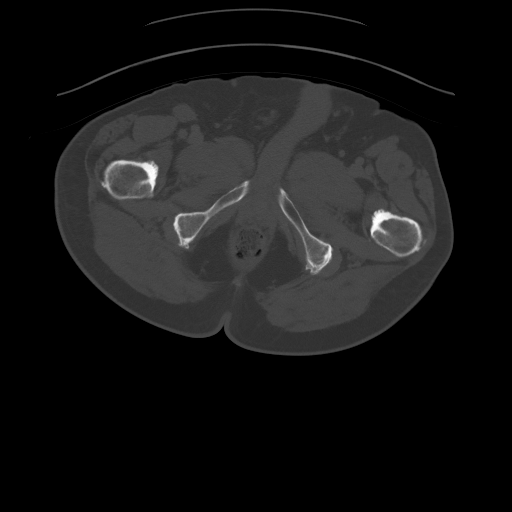
[im 26/104  soft-tissue]
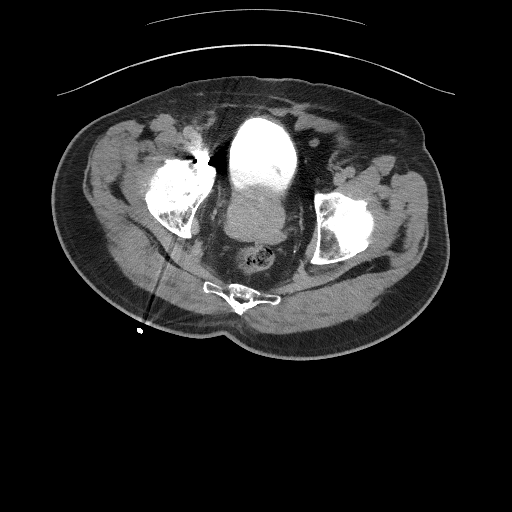
[im 39/104  soft-tissue]
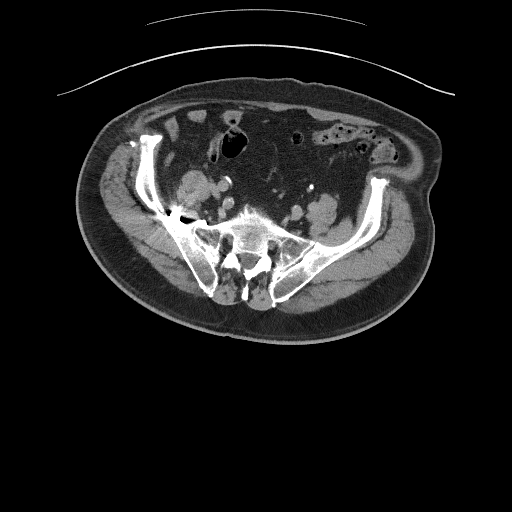
[im 52/104  soft-tissue]
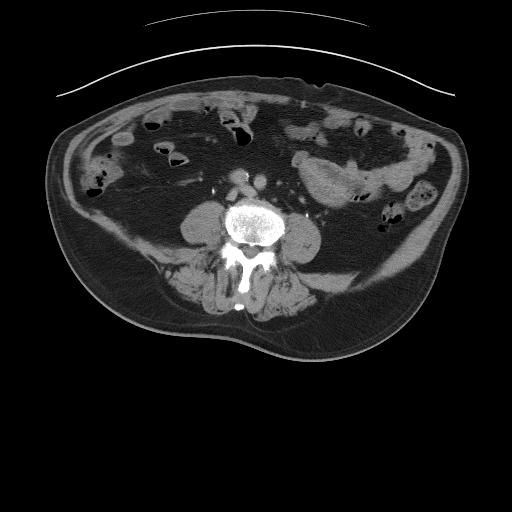
[im 52/104  lung]
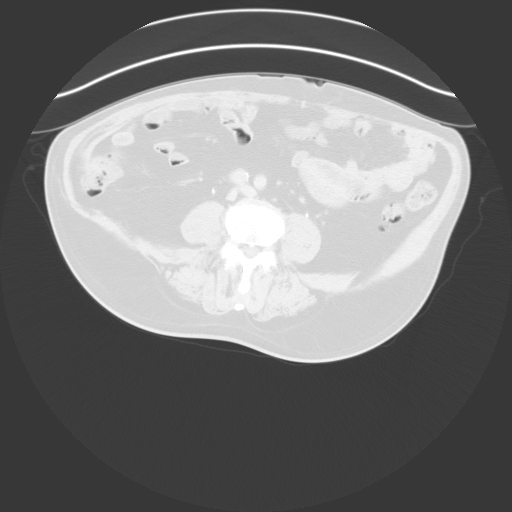
[im 65/104  soft-tissue]
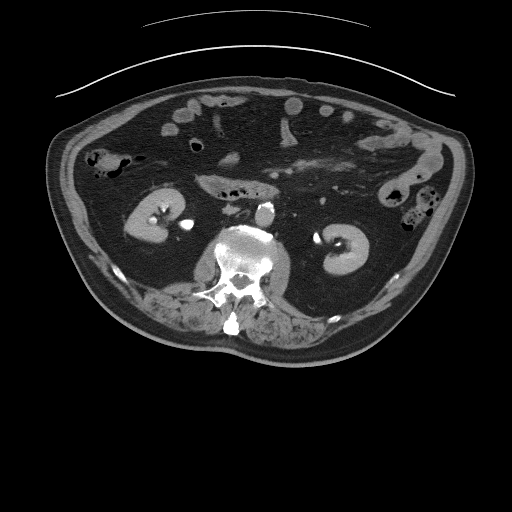
[im 65/104  lung]
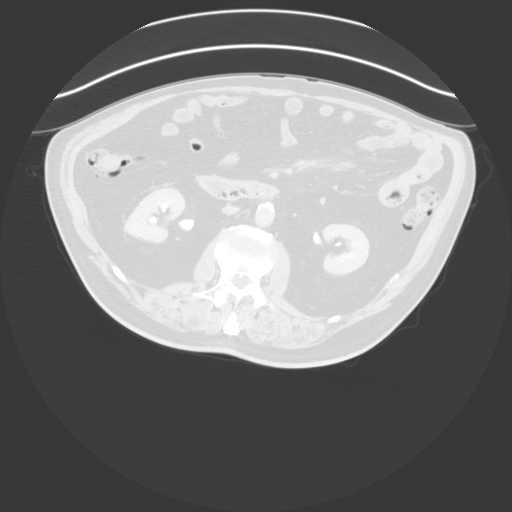
[im 78/104  soft-tissue]
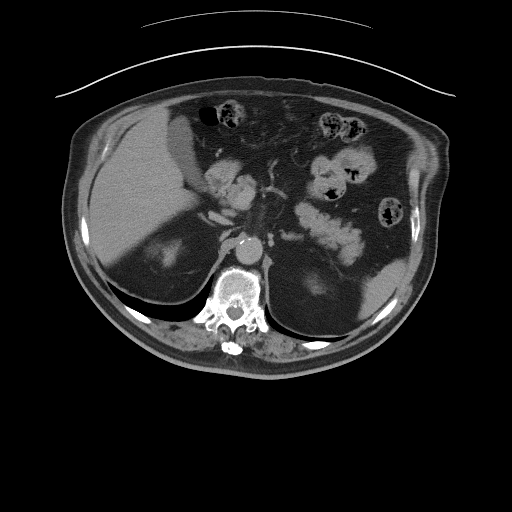
[im 78/104  lung]
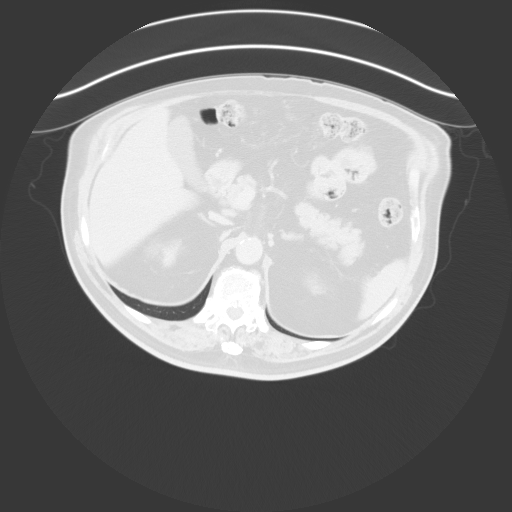
[im 91/104  soft-tissue]
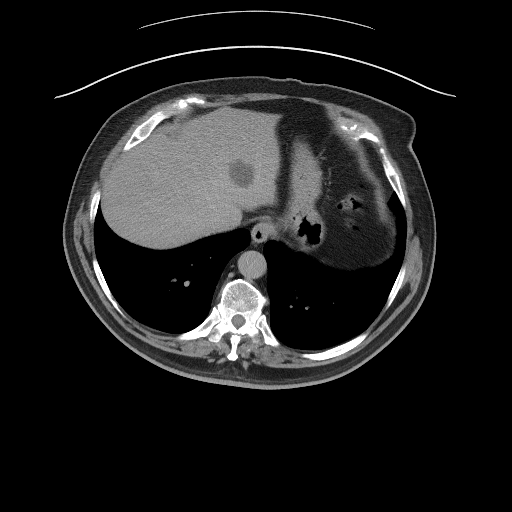
[im 91/104  lung]
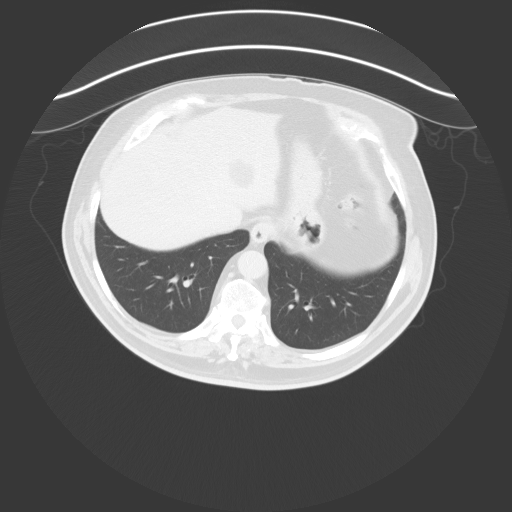

[Series 15: coronal delay · coronal · delayed · 0.82mm/px · 2 of 114 slices shown, 3 images]
[im 38/114  soft-tissue]
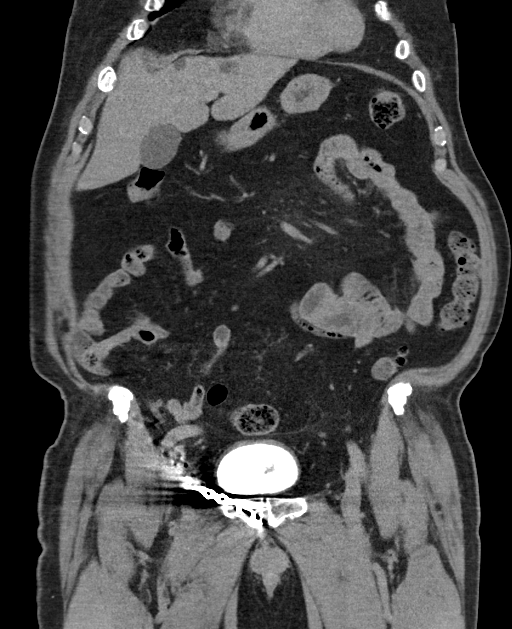
[im 38/114  bone]
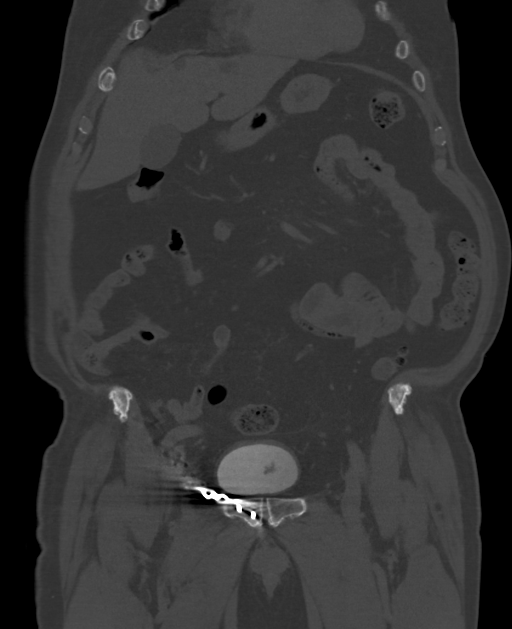
[im 76/114  soft-tissue]
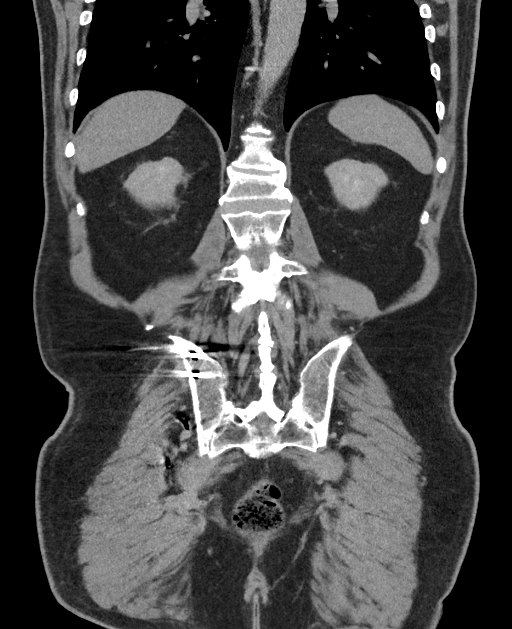

[12 of 46 positions shown; findings below may reference images not displayed]

RADIATION DOSE REDUCTION: This exam was performed according to the
departmental dose-optimization program which includes automated
exposure control, adjustment of the mA and/or kV according to
patient size and/or use of iterative reconstruction technique.

CONTRAST:  100mL OMNIPAQUE IOHEXOL 300 MG/ML  SOLN
FINDINGS: Lower chest: No acute abnormality.

Hepatobiliary: Liver is normal in size and contour. There are a few
hepatic cysts identified, largest in the left hepatic lobe segment 2
which contains a very thin internal septation. A few subcentimeter
hypodensities which are too small to characterize but also likely
represent cysts or hemangiomas. Gallbladder appears normal. No
biliary ductal dilatation identified.

Pancreas: Unremarkable. No pancreatic ductal dilatation or
surrounding inflammatory changes.

Spleen: Normal in size without focal abnormality.

Adrenals/Urinary Tract: Adrenal glands are normal. 5 mm calculus in
the mid left kidney and a few additional tiny microcalcifications
also seen in the left kidney. No hydronephrosis. No enhancing renal
mass or suspicious collecting system filling defects identified.
Mild urinary bladder wall thickening with no focal mass identified.

Stomach/Bowel: No bowel obstruction, free air or pneumatosis.
Colonic diverticulosis. No bowel wall edema identified. Appendix is
normal.

Vascular/Lymphatic: Aortic atherosclerosis. No enlarged abdominal or
pelvic lymph nodes.

Reproductive: Prostate gland is enlarged measuring 5.6 x 6.7 x
cm.

Other: No ascites.

Musculoskeletal: No suspicious bony lesions identified. Postsurgical
changes in the right pelvis.
IMPRESSION: 1. Left nephrolithiasis.  No hydronephrosis.
2. Prostatomegaly.
3. Colonic diverticulosis.
4. Hepatic cysts including a mildly complex cyst and a few
subcentimeter hypodensities which are too small to characterize but
also most likely represent cysts or hemangiomas.

## 2022-05-19 MED ORDER — MECLIZINE HCL 25 MG PO TABS: 25.0000 mg | ORAL_TABLET | Freq: Three times a day (TID) | ORAL | 0 refills | Status: DC | PRN

## 2022-05-19 NOTE — Progress Notes (Signed)
Date:  05/19/2022   Name:  Noah Ayala   DOB:  November 28, 1945   MRN:  [id number]   Chief Complaint: Dizziness (Nausea and vomitting. Gets worse when walking)  Dizziness This is a new problem. The current episode started 1 to 4 weeks ago. The problem occurs intermittently. The problem has been gradually improving. Associated symptoms include headaches and nausea. Pertinent negatives include no abdominal pain, chest pain, chills, congestion, coughing, fever, myalgias, neck pain, rash or sore throat. Associated symptoms comments: Flown to [facility]. He has tried nothing for the symptoms. The treatment provided mild relief.    Lab Results  Component Value Date   NA 142 04/08/2022   K 4.8 04/08/2022   CO2 25 04/08/2022   GLUCOSE 91 04/08/2022   BUN 13 04/08/2022   CREATININE 0.87 04/08/2022   CALCIUM 9.3 04/08/2022   EGFR 89 04/08/2022   Lab Results  Component Value Date   CHOL 178 04/08/2022   HDL 47 04/08/2022   LDLCALC 103 (H) 04/08/2022   TRIG 157 (H) 04/08/2022   No results found for: "TSH" No results found for: "HGBA1C" Lab Results  Component Value Date   WBC 8.6 02/20/2021   HGB 13.3 (A) 02/20/2021   HCT 45 02/20/2021   PLT 319 02/20/2021   Lab Results  Component Value Date   ALT 12 02/20/2021   AST 15 02/20/2021   ALKPHOS 49 02/20/2021   Lab Results  Component Value Date   VD25OH 57.1 02/20/2021     Review of Systems  Constitutional:  Negative for chills and fever.  HENT:  Negative for congestion, drooling, ear discharge, ear pain and sore throat.   Respiratory:  Negative for cough, shortness of breath and wheezing.   Cardiovascular:  Negative for chest pain, palpitations and leg swelling.  Gastrointestinal:  Positive for nausea. Negative for abdominal pain, blood in stool, constipation and diarrhea.  Endocrine: Negative for polydipsia.  Genitourinary:  Negative for dysuria, frequency, hematuria and urgency.  Musculoskeletal:  Negative for back pain,  myalgias and neck pain.  Skin:  Negative for rash.  Allergic/Immunologic: Negative for environmental allergies.  Neurological:  Positive for dizziness and headaches.  Hematological:  Does not bruise/bleed easily.  Psychiatric/Behavioral:  Negative for suicidal ideas. The patient is not nervous/anxious.     There are no problems to display for this patient.   No Known Allergies  No past surgical history on file.  Social History   Tobacco Use   Smoking status: Never   Smokeless tobacco: Never  Vaping Use   Vaping Use: Never used  Substance Use Topics   Alcohol use: Not Currently   Drug use: Not Currently     Medication list has been reviewed and updated.  Current Meds  Medication Sig   cholecalciferol (VITAMIN D3) 25 MCG (1000 UNIT) tablet Take 5,000 Units by mouth daily.   finasteride (PROSCAR) 5 MG tablet TAKE 1 TABLET (5 MG TOTAL) BY MOUTH DAILY.   losartan (COZAAR) 50 MG tablet Take 1 tablet (50 mg total) by mouth daily.   simvastatin (ZOCOR) 40 MG tablet Take 1 tablet (40 mg total) by mouth at bedtime. 1/2 tablet daily       05/19/2022    9:28 AM 05/16/2022   11:24 AM 04/08/2022    1:39 PM 04/03/2022    3:40 PM  GAD 7 : Generalized Anxiety Score  Nervous, Anxious, on Edge 0 0 0 1  Control/stop worrying 0 0 0 0  Worry too much -  different things 0 1 0 1  Trouble relaxing 0 0 0 0  Restless 0 0 1 0  Easily annoyed or irritable 0 0 0 1  Afraid - awful might happen 0 0 0 0  Total GAD 7 Score 0 $Remov'1 1 3  'jVBKhu$ Anxiety Difficulty Not difficult at all Not difficult at all Not difficult at all Not difficult at all       05/19/2022    9:28 AM 05/16/2022   11:15 AM 04/08/2022    1:39 PM  Depression screen PHQ 2/9  Decreased Interest 0 0 0  Down, Depressed, Hopeless 0 0 0  PHQ - 2 Score 0 0 0  Altered sleeping 0 0 0  Tired, decreased energy 0 0 1  Change in appetite 0 0 0  Feeling bad or failure about yourself  0 0 0  Trouble concentrating 1 0 1  Moving slowly or  fidgety/restless 0 0 0  Suicidal thoughts 0 0 0  PHQ-9 Score 1 0 2  Difficult doing work/chores Not difficult at all Not difficult at all Not difficult at all    BP Readings from Last 3 Encounters:  05/19/22 124/62  05/16/22 122/76  04/08/22 130/76    Physical Exam Vitals and nursing note reviewed.  HENT:     Head: Normocephalic.     Right Ear: Tympanic membrane and external ear normal.     Left Ear: External ear normal. A middle ear effusion is present. Tympanic membrane is not erythematous.     Nose: Nose normal.  Eyes:     General: No scleral icterus.       Right eye: No discharge.        Left eye: No discharge.     Conjunctiva/sclera: Conjunctivae normal.     Pupils: Pupils are equal, round, and reactive to light.  Neck:     Thyroid: No thyromegaly.     Vascular: No JVD.     Trachea: No tracheal deviation.  Cardiovascular:     Rate and Rhythm: Normal rate and regular rhythm.     Heart sounds: Normal heart sounds. No murmur heard.    No friction rub. No gallop.  Pulmonary:     Effort: No respiratory distress.     Breath sounds: Normal breath sounds. No wheezing or rales.  Abdominal:     General: Bowel sounds are normal.     Palpations: Abdomen is soft. There is no mass.     Tenderness: There is no abdominal tenderness. There is no guarding or rebound.  Musculoskeletal:        General: No tenderness. Normal range of motion.     Cervical back: Normal range of motion and neck supple.  Lymphadenopathy:     Cervical: No cervical adenopathy.  Skin:    General: Skin is warm.     Findings: No rash.  Neurological:     Mental Status: He is alert.     Cranial Nerves: Cranial nerves 2-12 are intact. No cranial nerve deficit.     Wt Readings from Last 3 Encounters:  05/19/22 206 lb (93.4 kg)  05/16/22 206 lb (93.4 kg)  04/08/22 207 lb (93.9 kg)    BP 124/62   Pulse 64   Ht $R'5\' 8"'kz$  (1.727 m)   Wt 206 lb (93.4 kg)   BMI 31.32 kg/m   Assessment and Plan:  1.  Benign paroxysmal positional vertigo, unspecified laterality Acute.  Episodic.  Currently stable.  Patient after flying to [facility]  had an episode of vertigo that he was unable to get out of bed without dizziness and nausea.  This gradually resolved on its own.  Patient has been given meclizine to take on a as needed basis and we will refer to ear nose and throat for evaluation.  Patient did look up and did well sounds like an Epley maneuver which helped at the time which I have encouraged that if he is not having any neck discomfort that he could try again in the future should this resume. - Ambulatory referral to ENT - meclizine (ANTIVERT) 25 MG tablet; Take 1 tablet (25 mg total) by mouth 3 (three) times daily as needed for dizziness.  Dispense: 30 tablet; Refill: 0    Otilio Miu, MD

## 2022-05-19 NOTE — Patient Instructions (Signed)
Benign Positional Vertigo Vertigo is the feeling that you or your surroundings are moving when they are not. Benign positional vertigo is the most common form of vertigo. This is usually a harmless condition (benign). This condition is positional. This means that symptoms are triggered by certain movements and positions. This condition can be dangerous if it occurs while you are doing something that could cause harm to yourself or others. This includes activities such as driving or operating machinery. What are the causes? The inner ear has fluid-filled canals that help your brain sense movement and balance. When the fluid moves, the brain receives messages about your body's position. With benign positional vertigo, calcium crystals in the inner ear break free and disturb the inner ear area. This causes your brain to receive confusing messages about your body's position. What increases the risk? You are more likely to develop this condition if: You are a woman. You are 76 years of age or older. You have recently had a head injury. You have an inner ear disease. What are the signs or symptoms? Symptoms of this condition usually happen when you move your head or your eyes in different directions. Symptoms may start suddenly and usually last for less than a minute. They include: Loss of balance and falling. Feeling like you are spinning or moving. Feeling like your surroundings are spinning or moving. Nausea and vomiting. Blurred vision. Dizziness. Involuntary eye movement (nystagmus). Symptoms can be mild and cause only minor problems, or they can be severe and interfere with daily life. Episodes of benign positional vertigo may return (recur) over time. Symptoms may also improve over time. How is this diagnosed? This condition may be diagnosed based on: Your medical history. A physical exam of the head, neck, and ears. Positional tests to check for or stimulate vertigo. You may be asked to  turn your head and change positions, such as going from sitting to lying down. A health care provider will watch for symptoms of vertigo. You may be referred to a health care provider who specializes in ear, nose, and throat problems (ENT or otolaryngologist) or a provider who specializes in disorders of the nervous system (neurologist). How is this treated?  This condition may be treated in a session in which your health care provider moves your head in specific positions to help the displaced crystals in your inner ear move. Treatment for this condition may take several sessions. Surgery may be needed in severe cases, but this is rare. In some cases, benign positional vertigo may resolve on its own in 2-4 weeks. Follow these instructions at home: Safety Move slowly. Avoid sudden body or head movements or certain positions, as told by your health care provider. Avoid driving or operating machinery until your health care provider says it is safe. Avoid doing any tasks that would be dangerous to you or others if vertigo occurs. If you have trouble walking or keeping your balance, try using a cane for stability. If you feel dizzy or unstable, sit down right away. Return to your normal activities as told by your health care provider. Ask your health care provider what activities are safe for you. General instructions Take over-the-counter and prescription medicines only as told by your health care provider. Drink enough fluid to keep your urine pale yellow. Keep all follow-up visits. This is important. Contact a health care provider if: You have a fever. Your condition gets worse or you develop new symptoms. Your family or friends notice any behavioral changes.   You have nausea or vomiting that gets worse. You have numbness or a prickling and tingling sensation. Get help right away if you: Have difficulty speaking or moving. Are always dizzy or faint. Develop severe headaches. Have weakness in  your legs or arms. Have changes in your hearing or vision. Develop a stiff neck. Develop sensitivity to light. These symptoms may represent a serious problem that is an emergency. Do not wait to see if the symptoms will go away. Get medical help right away. Call your local emergency services (911 in the [place] Do not drive yourself to the hospital. Summary Vertigo is the feeling that you or your surroundings are moving when they are not. Benign positional vertigo is the most common form of vertigo. This condition is caused by calcium crystals in the inner ear that become displaced. This causes a disturbance in an area of the inner ear that helps your brain sense movement and balance. Symptoms include loss of balance and falling, feeling that you or your surroundings are moving, nausea and vomiting, and blurred vision. This condition can be diagnosed based on symptoms, a physical exam, and positional tests. Follow safety instructions as told by your health care provider and keep all follow-up visits. This is important. This information is not intended to replace advice given to you by your health care provider. Make sure you discuss any questions you have with your health care provider. Document Revised: 08/08/2020 Document Reviewed: 08/08/2020 Elsevier Patient Education  2023 [employer]vier Inc.  

## 2022-05-20 DIAGNOSIS — I700 Atherosclerosis of aorta: Secondary | ICD-10-CM | POA: Insufficient documentation

## 2022-05-20 DIAGNOSIS — E782 Mixed hyperlipidemia: Secondary | ICD-10-CM | POA: Insufficient documentation

## 2022-05-20 DIAGNOSIS — I10 Essential (primary) hypertension: Secondary | ICD-10-CM | POA: Insufficient documentation

## 2022-07-25 ENCOUNTER — Ambulatory Visit (INDEPENDENT_AMBULATORY_CARE_PROVIDER_SITE_OTHER): Payer: Medicare Other | Admitting: Urology

## 2022-07-25 ENCOUNTER — Other Ambulatory Visit: Payer: Self-pay | Admitting: Family Medicine

## 2022-07-25 VITALS — BP 175/75 | HR 68 | Ht 68.0 in | Wt 206.0 lb

## 2022-07-25 DIAGNOSIS — N401 Enlarged prostate with lower urinary tract symptoms: Secondary | ICD-10-CM | POA: Diagnosis not present

## 2022-07-25 DIAGNOSIS — R310 Gross hematuria: Secondary | ICD-10-CM | POA: Diagnosis not present

## 2022-07-25 DIAGNOSIS — R972 Elevated prostate specific antigen [PSA]: Secondary | ICD-10-CM | POA: Diagnosis not present

## 2022-07-25 DIAGNOSIS — N529 Male erectile dysfunction, unspecified: Secondary | ICD-10-CM | POA: Diagnosis not present

## 2022-07-25 DIAGNOSIS — N400 Enlarged prostate without lower urinary tract symptoms: Secondary | ICD-10-CM

## 2022-07-25 LAB — URINALYSIS, COMPLETE
Bilirubin, UA: NEGATIVE
Glucose, UA: NEGATIVE
Leukocytes,UA: NEGATIVE
Nitrite, UA: NEGATIVE
Protein,UA: NEGATIVE
Specific Gravity, UA: 1.025 (ref 1.005–1.030)
Urobilinogen, Ur: 0.2 mg/dL (ref 0.2–1.0)
pH, UA: 6 (ref 5.0–7.5)

## 2022-07-25 LAB — MICROSCOPIC EXAMINATION

## 2022-07-25 MED ORDER — MIRABEGRON ER 25 MG PO TB24: 25.0000 mg | ORAL_TABLET | Freq: Every day | ORAL | 0 refills | Status: AC

## 2022-07-25 MED ORDER — TADALAFIL 20 MG PO TABS: ORAL_TABLET | ORAL | 0 refills | Status: AC

## 2022-07-25 NOTE — Progress Notes (Unsigned)
07/25/2022 2:16 PM   Noah Ayala 1946-06-02 [id number]  Referring provider: Duanne Limerick, MD [address] [place],  [place] [address]  Chief Complaint  Patient presents with   Hematuria   Follow-up    HPI: 76 y.o. male who presents for 58-month follow-up  Prior urologic records [place]:  Was followed for BPH, elevated PSA and gross hematuria Gross hematuria evaluations 2014, 2017 and 2018 showed no significant abnormalities.  Bladder biopsy in 2017 which was negative Elevated PSA since 2015 has fluctuated from 3.6-6.0; benign prostate biopsy 2015 for PSA 5.9.  PSA April 2022 was 30 but on repeat back to baseline at 5.1 Has previously been on finasteride and alfuzosin but currently on no medications for BPH.  Presently without bothersome LUTS  Initially seen here 12/18/2021 for gross hematuria CTU showed a 5 mm nonobstructing left renal calculus and prostate volume 124 cc Cystoscopy with significant BPH/hypervascularity and friability Restarted finasteride 5 mg daily Denies recurrent gross hematuria since his last visit.  He does have some storage related voiding symptoms including frequency, urgency and nocturia x2 IPSS 12/35 He was interested in additional medication for the symptoms  Also with some difficulty achieving and maintaining erection and was requesting a PDE 5 inhibitor trial.  No sublingual nitrate therapy   PMH: Past Medical History:  Diagnosis Date   Hypertension     Surgical History: No past surgical history on file.  Home Medications:  Allergies as of 07/25/2022   No Known Allergies      Medication List        Accurate as of July 25, 2022  2:16 PM. If you have any questions, ask your nurse or doctor.          STOP taking these medications    meclizine 25 MG tablet Commonly known as: ANTIVERT       TAKE these medications    cholecalciferol 25 MCG (1000 UNIT) tablet Commonly known as: VITAMIN D3 Take 5,000 Units  by mouth daily.   finasteride 5 MG tablet Commonly known as: PROSCAR TAKE 1 TABLET (5 MG TOTAL) BY MOUTH DAILY.   losartan 50 MG tablet Commonly known as: COZAAR Take 1 tablet (50 mg total) by mouth daily.   mirabegron ER 25 MG Tb24 tablet Commonly known as: MYRBETRIQ Take 1 tablet (25 mg total) by mouth daily.   simvastatin 40 MG tablet Commonly known as: ZOCOR Take 1 tablet (40 mg total) by mouth at bedtime. 1/2 tablet daily   tadalafil 20 MG tablet Commonly known as: CIALIS Take 1 tablet 1 hour prior to intercourse.        Allergies: No Known Allergies  Family History: No family history on file.  Social History:  reports that he has never smoked. He has never used smokeless tobacco. He reports that he does not currently use alcohol. He reports that he does not currently use drugs.   Physical Exam: BP (!) 175/75   Pulse 68   Ht 5\' 8"  (1.727 m)   Wt 206 lb (93.4 kg)   BMI 31.32 kg/m   Constitutional:  Alert and oriented, No acute distress. HEENT: [place] AT Respiratory: Normal respiratory effort, no increased work of breathing. Psychiatric: Normal mood and affect.  Laboratory Data:  Urinalysis Microscopy negative WBC/RBC    Assessment & Plan:    1.  History gross hematuria Resolved  2.  Elevated PSA PSA March 2023 was 6.2 which was stable.  Repeat PSA level since he has been  on finasteride x6 months  3.  BPH with LUTS Continue finasteride Trial Myrbetriq 25 mg daily for his storage related voiding symptoms.  He will call back regarding efficacy  4.  Erectile dysfunction Trial tadalafil 20 mg 1 hour prior to intercourse   Follow-up 1 year    Abbie Sons, MD  [facility] [address], [place], [place] [address] [phone]

## 2022-07-27 ENCOUNTER — Encounter: Payer: Self-pay | Admitting: Urology

## 2022-07-29 ENCOUNTER — Other Ambulatory Visit
Admission: RE | Admit: 2022-07-29 | Discharge: 2022-07-29 | Disposition: A | Discharge status: Home / Self Care | Payer: Medicare Other | Attending: Family Medicine | Admitting: Family Medicine

## 2022-07-29 DIAGNOSIS — R972 Elevated prostate specific antigen [PSA]: Secondary | ICD-10-CM | POA: Diagnosis present

## 2022-07-29 LAB — PSA: Prostatic Specific Antigen: 3.55 ng/mL (ref 0.00–4.00)

## 2022-08-04 ENCOUNTER — Telehealth: Payer: Self-pay | Admitting: Family Medicine

## 2022-08-04 DIAGNOSIS — R972 Elevated prostate specific antigen [PSA]: Secondary | ICD-10-CM

## 2022-08-04 NOTE — Telephone Encounter (Signed)
-----   Message from Honor Loh, [place] sent at 08/01/2022  3:57 PM EST -----  ----- Message ----- From: Riki Altes, MD Sent: 08/01/2022   3:31 PM EST To: Levada Schilling, CMA  PSA level 3.55.  Finasteride decreases the PSA by 50% which is equivalent to a PSA of 7.2.  Recommend lab visit with a repeat PSA in 6 months

## 2022-08-04 NOTE — Telephone Encounter (Signed)
LMOM for patient to return call and schedule a 6 month lab appointment for PSA.

## 2022-08-05 NOTE — Telephone Encounter (Signed)
Patent notified and voiced understanding. Lab appointment has been made.

## 2022-08-10 ENCOUNTER — Other Ambulatory Visit: Payer: Self-pay | Admitting: Family Medicine

## 2022-08-10 DIAGNOSIS — I10 Essential (primary) hypertension: Secondary | ICD-10-CM

## 2022-08-12 ENCOUNTER — Ambulatory Visit: Payer: Medicare Other | Admitting: Family Medicine

## 2022-08-28 ENCOUNTER — Other Ambulatory Visit: Payer: Self-pay | Admitting: Family Medicine

## 2022-08-28 DIAGNOSIS — I10 Essential (primary) hypertension: Secondary | ICD-10-CM

## 2022-08-28 MED ORDER — LOSARTAN POTASSIUM 50 MG PO TABS: 50.0000 mg | ORAL_TABLET | Freq: Every day | ORAL | 0 refills | Status: DC

## 2022-08-28 NOTE — Telephone Encounter (Signed)
Future visit in 4 days. BP 175/75 07/25/22. Will give #30 0 refills until future visit in case PCP wants to increase dose. Requested Prescriptions  Pending Prescriptions Disp Refills   losartan (COZAAR) 50 MG tablet 30 tablet 0    Sig: Take 1 tablet (50 mg total) by mouth daily.     Cardiovascular:  Angiotensin Receptor Blockers Failed - 08/28/2022  2:17 PM      Failed - Last BP in normal range    BP Readings from Last 1 Encounters:  07/25/22 (!) 175/75         Passed - Cr in normal range and within 180 days    Creatinine, Ser  Date Value Ref Range Status  04/08/2022 0.87 0.76 - 1.27 mg/dL Final         Passed - K in normal range and within 180 days    Potassium  Date Value Ref Range Status  04/08/2022 4.8 3.5 - 5.2 mmol/L Final         Passed - Patient is not pregnant      Passed - Valid encounter within last 6 months    Recent Outpatient Visits           3 months ago Benign paroxysmal positional vertigo, unspecified laterality   [facility]   4 months ago Nonexertional chest pain   [facility]   4 months ago Primary hypertension   [place] [facility], [facility]       Future Appointments             In 4 days Duanne Limerick, MD [facility]   In 11 months Stoioff, Verna Czech, MD [facility] [place]

## 2022-08-28 NOTE — Telephone Encounter (Signed)
Medication Refill - Medication: losartan (COZAAR) 50 MG tablet   Has the patient contacted their pharmacy? No. (Agent: If no, request that the patient contact the pharmacy for the refill. If patient does not wish to contact the pharmacy document the reason why and proceed with request.) (Agent: If yes, when and what did the pharmacy advise?)  Preferred Pharmacy (with phone number or street name):  [id number] [place],  - [address] Phone: [phone]  Fax: [phone]     Has the patient been seen for an appointment in the last year OR does the patient have an upcoming appointment? Yes.    Agent: Please be advised that RX refills may take up to 3 business days. We ask that you follow-up with your pharmacy.

## 2022-09-01 ENCOUNTER — Encounter: Payer: Self-pay | Admitting: Family Medicine

## 2022-09-01 ENCOUNTER — Ambulatory Visit (INDEPENDENT_AMBULATORY_CARE_PROVIDER_SITE_OTHER): Payer: Medicare Other | Admitting: Family Medicine

## 2022-09-01 VITALS — BP 164/102 | HR 76 | Ht 68.0 in | Wt 210.0 lb

## 2022-09-01 DIAGNOSIS — I10 Essential (primary) hypertension: Secondary | ICD-10-CM

## 2022-09-01 DIAGNOSIS — E785 Hyperlipidemia, unspecified: Secondary | ICD-10-CM

## 2022-09-01 MED ORDER — LOSARTAN POTASSIUM 50 MG PO TABS: 50.0000 mg | ORAL_TABLET | Freq: Every day | ORAL | 0 refills | Status: DC

## 2022-09-01 MED ORDER — SIMVASTATIN 40 MG PO TABS: 40.0000 mg | ORAL_TABLET | Freq: Every day | ORAL | 1 refills | Status: DC

## 2022-09-01 MED ORDER — HYDROCHLOROTHIAZIDE 12.5 MG PO TABS: 12.5000 mg | ORAL_TABLET | Freq: Every day | ORAL | 0 refills | Status: DC

## 2022-09-01 NOTE — Progress Notes (Signed)
Date:  09/01/2022   Name:  Noah Ayala   DOB:  [birth year]   MRN:  [id number]   Chief Complaint: Hypertension (Out of med x 1 week/) and Hyperlipidemia  Hypertension This is a chronic problem. The current episode started more than 1 year ago. The problem has been gradually improving since onset. The problem is controlled. Pertinent negatives include no anxiety, blurred vision, chest pain, headaches, malaise/fatigue, neck pain, orthopnea, palpitations, peripheral edema, PND, shortness of breath or sweats. There are no associated agents to hypertension. Risk factors for coronary artery disease include dyslipidemia. Past treatments include angiotensin blockers.  Hyperlipidemia Pertinent negatives include no chest pain, myalgias or shortness of breath.    Lab Results  Component Value Date   NA 142 04/08/2022   K 4.8 04/08/2022   CO2 25 04/08/2022   GLUCOSE 91 04/08/2022   BUN 13 04/08/2022   CREATININE 0.87 04/08/2022   CALCIUM 9.3 04/08/2022   EGFR 89 04/08/2022   Lab Results  Component Value Date   CHOL 178 04/08/2022   HDL 47 04/08/2022   LDLCALC 103 (H) 04/08/2022   TRIG 157 (H) 04/08/2022   No results found for: "TSH" No results found for: "HGBA1C" Lab Results  Component Value Date   WBC 8.6 02/20/2021   HGB 13.3 (A) 02/20/2021   HCT 45 02/20/2021   PLT 319 02/20/2021   Lab Results  Component Value Date   ALT 12 02/20/2021   AST 15 02/20/2021   ALKPHOS 49 02/20/2021   Lab Results  Component Value Date   VD25OH 57.1 02/20/2021     Review of Systems  Constitutional:  Negative for chills, fever and malaise/fatigue.  HENT:  Negative for drooling, ear discharge, ear pain and sore throat.   Eyes:  Negative for blurred vision.  Respiratory:  Negative for cough, shortness of breath and wheezing.   Cardiovascular:  Negative for chest pain, palpitations, orthopnea, leg swelling and PND.  Gastrointestinal:  Negative for abdominal pain, blood in stool, constipation,  diarrhea and nausea.  Endocrine: Negative for polydipsia.  Genitourinary:  Negative for dysuria, frequency, hematuria and urgency.  Musculoskeletal:  Negative for back pain, myalgias and neck pain.  Skin:  Negative for rash.  Allergic/Immunologic: Negative for environmental allergies.  Neurological:  Negative for dizziness and headaches.  Hematological:  Does not bruise/bleed easily.  Psychiatric/Behavioral:  Negative for suicidal ideas. The patient is not nervous/anxious.     There are no problems to display for this patient.   No Known Allergies  No past surgical history on file.  Social History   Tobacco Use   Smoking status: Never   Smokeless tobacco: Never  Vaping Use   Vaping Use: Never used  Substance Use Topics   Alcohol use: Not Currently   Drug use: Not Currently     Medication list has been reviewed and updated.  Current Meds  Medication Sig   cholecalciferol (VITAMIN D3) 25 MCG (1000 UNIT) tablet Take 5,000 Units by mouth daily.   finasteride (PROSCAR) 5 MG tablet TAKE 1 TABLET (5 MG TOTAL) BY MOUTH DAILY.   losartan (COZAAR) 50 MG tablet Take 1 tablet (50 mg total) by mouth daily.   mirabegron ER (MYRBETRIQ) 25 MG TB24 tablet Take 1 tablet (25 mg total) by mouth daily.   simvastatin (ZOCOR) 40 MG tablet Take 1 tablet (40 mg total) by mouth at bedtime. 1/2 tablet daily   tadalafil (CIALIS) 20 MG tablet Take 1 tablet 1 hour prior to intercourse.  09/01/2022    3:37 PM 05/19/2022    9:28 AM 05/16/2022   11:24 AM 04/08/2022    1:39 PM  GAD 7 : Generalized Anxiety Score  Nervous, Anxious, on Edge 0 0 0 0  Control/stop worrying 0 0 0 0  Worry too much - different things 0 0 1 0  Trouble relaxing 0 0 0 0  Restless 0 0 0 1  Easily annoyed or irritable 0 0 0 0  Afraid - awful might happen 0 0 0 0  Total GAD 7 Score 0 0 1 1  Anxiety Difficulty Not difficult at all Not difficult at all Not difficult at all Not difficult at all       09/01/2022     3:37 PM 05/19/2022    9:28 AM 05/16/2022   11:15 AM  Depression screen PHQ 2/9  Decreased Interest 0 0 0  Down, Depressed, Hopeless 0 0 0  PHQ - 2 Score 0 0 0  Altered sleeping 0 0 0  Tired, decreased energy 0 0 0  Change in appetite 0 0 0  Feeling bad or failure about yourself  0 0 0  Trouble concentrating 0 1 0  Moving slowly or fidgety/restless 0 0 0  Suicidal thoughts 0 0 0  PHQ-9 Score 0 1 0  Difficult doing work/chores Not difficult at all Not difficult at all Not difficult at all    BP Readings from Last 3 Encounters:  09/01/22 (!) 164/102  07/25/22 (!) 175/75  05/19/22 124/62    Physical Exam Vitals and nursing note reviewed.  HENT:     Head: Normocephalic.     Right Ear: Tympanic membrane normal.     Left Ear: Tympanic membrane normal.     Nose: Nose normal.  Eyes:     Conjunctiva/sclera: Conjunctivae normal.  Cardiovascular:     Rate and Rhythm: Normal rate and regular rhythm.     Pulses: Normal pulses.     Heart sounds: No murmur heard.    No friction rub. No gallop.  Pulmonary:     Breath sounds: Normal breath sounds. No wheezing, rhonchi or rales.  Musculoskeletal:     Cervical back: Neck supple.     Wt Readings from Last 3 Encounters:  09/01/22 210 lb (95.3 kg)  07/25/22 206 lb (93.4 kg)  05/19/22 206 lb (93.4 kg)    BP (!) 164/102 (BP Location: Right Arm, Cuff Size: Large)   Pulse 76   Ht _0  (1.727 m)   Wt 210 lb (95.3 kg)   SpO2 97%   BMI 31.93 kg/m   Assessment and Plan:  1. Primary hypertension Chronic.  Uncontrolled.  Stable.  Somewhat noncompliant and that he has not had his losartan for over a month and took his first dose last night.  We will add hydrochlorothiazide 12.5 mg once a day to the regimen and will encourage a Dash diet we will add hydrochlorothiazide 12.5 mg once a day and encourage a Dash diet.  We will recheck patient in 6 weeks.  I have discussed with patient's the importance of taking the medication on a daily basis  in order to maintain a normal blood pressure.  Will hold on lab work until patient returns at which time we can obtain blood pressure reading on diuretic and assess electrolytes involvement of the diuretic. - losartan (COZAAR) 50 MG tablet; Take 1 tablet (50 mg total) by mouth daily.  Dispense: 90 tablet; Refill: 0 - hydrochlorothiazide (HYDRODIURIL) 12.5 MG tablet;  Take 1 tablet (12.5 mg total) by mouth daily.  Dispense: 90 tablet; Refill: 0  2. Hyperlipidemia, unspecified hyperlipidemia type Chronic.  Controlled.  Stable.  Continue simvastatin 20 mg which is a 40 mg tablet in half a day.  Upon return in 6 weeks we will likely check - simvastatin (ZOCOR) 40 MG tablet; Take 1 tablet (40 mg total) by mouth at bedtime. 1/2 tablet daily  Dispense: 45 tablet; Refill: 1    Otilio Miu, MD

## 2022-09-01 NOTE — Patient Instructions (Signed)

## 2022-09-27 ENCOUNTER — Other Ambulatory Visit: Payer: Self-pay | Admitting: Family Medicine

## 2022-09-27 DIAGNOSIS — I10 Essential (primary) hypertension: Secondary | ICD-10-CM

## 2022-10-13 ENCOUNTER — Encounter: Payer: Self-pay | Admitting: Family Medicine

## 2022-10-13 ENCOUNTER — Other Ambulatory Visit: Payer: Self-pay | Admitting: Family Medicine

## 2022-10-13 ENCOUNTER — Ambulatory Visit (INDEPENDENT_AMBULATORY_CARE_PROVIDER_SITE_OTHER): Payer: Medicare Other | Admitting: Family Medicine

## 2022-10-13 VITALS — BP 128/78 | HR 67 | Ht 68.0 in | Wt 209.0 lb

## 2022-10-13 DIAGNOSIS — E785 Hyperlipidemia, unspecified: Secondary | ICD-10-CM | POA: Diagnosis not present

## 2022-10-13 DIAGNOSIS — I10 Essential (primary) hypertension: Secondary | ICD-10-CM | POA: Diagnosis not present

## 2022-10-13 MED ORDER — SIMVASTATIN 40 MG PO TABS: 40.0000 mg | ORAL_TABLET | Freq: Every day | ORAL | 1 refills | Status: DC

## 2022-10-13 MED ORDER — LOSARTAN POTASSIUM 50 MG PO TABS: 50.0000 mg | ORAL_TABLET | Freq: Every day | ORAL | 1 refills | Status: DC

## 2022-10-13 MED ORDER — HYDROCHLOROTHIAZIDE 12.5 MG PO TABS: 12.5000 mg | ORAL_TABLET | Freq: Every day | ORAL | 1 refills | Status: DC

## 2022-10-13 NOTE — Progress Notes (Signed)
Date:  10/13/2022   Name:  Noah Ayala   DOB:  03/20/1946   MRN:  [id number]   Chief Complaint: Hypertension and Hyperlipidemia  Hypertension This is a chronic problem. The current episode started more than 1 year ago. The problem has been gradually improving since onset. The problem is controlled. Pertinent negatives include no chest pain, headaches, orthopnea, palpitations, PND or shortness of breath. There are no associated agents to hypertension. Past treatments include angiotensin blockers and diuretics. The current treatment provides moderate improvement. There are no compliance problems.  There is no history of angina, kidney disease, CAD/MI, CVA, heart failure, left ventricular hypertrophy, PVD or retinopathy. There is no history of chronic renal disease, a hypertension causing med or renovascular disease.  Hyperlipidemia This is a chronic problem. The current episode started more than 1 year ago. The problem is controlled. Recent lipid tests were reviewed and are normal. He has no history of chronic renal disease, diabetes, hypothyroidism, liver disease, obesity or nephrotic syndrome. Pertinent negatives include no chest pain, focal sensory loss, focal weakness, leg pain, myalgias or shortness of breath. Current antihyperlipidemic treatment includes statins. The current treatment provides moderate improvement of lipids. There are no compliance problems.  Risk factors for coronary artery disease include dyslipidemia.    Lab Results  Component Value Date   NA 142 04/08/2022   K 4.8 04/08/2022   CO2 25 04/08/2022   GLUCOSE 91 04/08/2022   BUN 13 04/08/2022   CREATININE 0.87 04/08/2022   CALCIUM 9.3 04/08/2022   EGFR 89 04/08/2022   Lab Results  Component Value Date   CHOL 178 04/08/2022   HDL 47 04/08/2022   LDLCALC 103 (H) 04/08/2022   TRIG 157 (H) 04/08/2022   No results found for: "TSH" No results found for: "HGBA1C" Lab Results  Component Value Date   WBC 8.6  02/20/2021   HGB 13.3 (A) 02/20/2021   HCT 45 02/20/2021   PLT 319 02/20/2021   Lab Results  Component Value Date   ALT 12 02/20/2021   AST 15 02/20/2021   ALKPHOS 49 02/20/2021   Lab Results  Component Value Date   VD25OH 57.1 02/20/2021     Review of Systems  Constitutional:  Negative for chills, diaphoresis, fatigue and fever.  HENT:  Negative for trouble swallowing.   Eyes:  Negative for visual disturbance.  Respiratory:  Negative for cough, chest tightness, shortness of breath and wheezing.   Cardiovascular:  Negative for chest pain, palpitations, orthopnea, leg swelling and PND.  Gastrointestinal:  Negative for abdominal pain and blood in stool.  Endocrine: Negative for polydipsia and polyuria.  Genitourinary:  Positive for urgency. Negative for difficulty urinating.  Musculoskeletal:  Negative for myalgias.  Skin:  Negative for color change and rash.  Neurological:  Negative for focal weakness and headaches.    There are no problems to display for this patient.   No Known Allergies  No past surgical history on file.  Social History   Tobacco Use   Smoking status: Never   Smokeless tobacco: Never  Vaping Use   Vaping Use: Never used  Substance Use Topics   Alcohol use: Not Currently   Drug use: Not Currently     Medication list has been reviewed and updated.  Current Meds  Medication Sig   cholecalciferol (VITAMIN D3) 25 MCG (1000 UNIT) tablet Take 5,000 Units by mouth daily.   finasteride (PROSCAR) 5 MG tablet TAKE 1 TABLET (5 MG TOTAL) BY MOUTH DAILY.  hydrochlorothiazide (HYDRODIURIL) 12.5 MG tablet Take 1 tablet (12.5 mg total) by mouth daily.   losartan (COZAAR) 50 MG tablet TAKE 1 TABLET BY MOUTH EVERY DAY   simvastatin (ZOCOR) 40 MG tablet Take 1 tablet (40 mg total) by mouth at bedtime. 1/2 tablet daily   tadalafil (CIALIS) 20 MG tablet Take 1 tablet 1 hour prior to intercourse.       10/13/2022   10:02 AM 09/01/2022    3:37 PM 05/19/2022     9:28 AM 05/16/2022   11:24 AM  GAD 7 : Generalized Anxiety Score  Nervous, Anxious, on Edge 0 0 0 0  Control/stop worrying 0 0 0 0  Worry too much - different things 0 0 0 1  Trouble relaxing 0 0 0 0  Restless 0 0 0 0  Easily annoyed or irritable 0 0 0 0  Afraid - awful might happen 0 0 0 0  Total GAD 7 Score 0 0 0 1  Anxiety Difficulty Not difficult at all Not difficult at all Not difficult at all Not difficult at all       10/13/2022   10:02 AM 09/01/2022    3:37 PM 05/19/2022    9:28 AM  Depression screen PHQ 2/9  Decreased Interest 0 0 0  Down, Depressed, Hopeless 0 0 0  PHQ - 2 Score 0 0 0  Altered sleeping 0 0 0  Tired, decreased energy 0 0 0  Change in appetite 0 0 0  Feeling bad or failure about yourself  0 0 0  Trouble concentrating 0 0 1  Moving slowly or fidgety/restless 0 0 0  Suicidal thoughts 0 0 0  PHQ-9 Score 0 0 1  Difficult doing work/chores Not difficult at all Not difficult at all Not difficult at all    BP Readings from Last 3 Encounters:  10/13/22 128/78  09/01/22 (!) 164/102  07/25/22 (!) 175/75    Physical Exam Vitals and nursing note reviewed.  HENT:     Head: Normocephalic.     Right Ear: External ear normal.     Left Ear: External ear normal.     Nose: Nose normal.  Eyes:     General: No scleral icterus.       Right eye: No discharge.        Left eye: No discharge.     Conjunctiva/sclera: Conjunctivae normal.     Pupils: Pupils are equal, round, and reactive to light.  Neck:     Thyroid: No thyromegaly.     Vascular: No JVD.     Trachea: No tracheal deviation.  Cardiovascular:     Rate and Rhythm: Normal rate and regular rhythm.     Heart sounds: Normal heart sounds. No murmur heard.    No friction rub. No gallop.  Pulmonary:     Effort: No respiratory distress.     Breath sounds: Normal breath sounds. No wheezing, rhonchi or rales.  Abdominal:     General: Bowel sounds are normal.     Palpations: Abdomen is soft. There is  no mass.     Tenderness: There is no abdominal tenderness. There is no guarding or rebound.  Musculoskeletal:        General: No tenderness. Normal range of motion.     Cervical back: Normal range of motion and neck supple.  Lymphadenopathy:     Cervical: No cervical adenopathy.  Skin:    General: Skin is warm.     Findings: No rash.  Neurological:     Mental Status: He is alert and oriented to person, place, and time.     Cranial Nerves: No cranial nerve deficit.     Deep Tendon Reflexes: Reflexes are normal and symmetric.     Wt Readings from Last 3 Encounters:  10/13/22 209 lb (94.8 kg)  09/01/22 210 lb (95.3 kg)  07/25/22 206 lb (93.4 kg)    BP 128/78   Pulse 67   Ht 5\' 8"  (1.727 m)   Wt 209 lb (94.8 kg)   SpO2 98%   BMI 31.78 kg/m   Assessment and Plan:  1. Primary hypertension Chronic.  Controlled.  Stable.  Blood pressure today is 128/78.  Asymptomatic.  Tolerating medications well.  Continue hydrochlorothiazide 12.5 mg once a day and losartan 50 mg once a day.  Will check comprehensive metabolic panel for electrolytes and GFR.  Will recheck in 6 months. - hydrochlorothiazide (HYDRODIURIL) 12.5 MG tablet; Take 1 tablet (12.5 mg total) by mouth daily.  Dispense: 90 tablet; Refill: 1 - losartan (COZAAR) 50 MG tablet; Take 1 tablet (50 mg total) by mouth daily.  Dispense: 90 tablet; Refill: 1 - Comprehensive Metabolic Panel (CMET)  2. Hyperlipidemia, unspecified hyperlipidemia type Chronic.  Controlled.  Stable.  Reemphasized low-cholesterol low triglyceride dietary guidelines.  We will also continue simvastatin 40 mg 1/2 tablet at bedtime.  Will recheck lipid panel for current status of LDL control and will recheck in 6 months. - simvastatin (ZOCOR) 40 MG tablet; Take 1 tablet (40 mg total) by mouth at bedtime. 1/2 tablet daily  Dispense: 45 tablet; Refill: 1 - Lipid Panel With LDL/HDL Ratio    , MD

## 2022-10-14 LAB — COMPREHENSIVE METABOLIC PANEL
ALT: 15 IU/L (ref 0–44)
AST: 14 IU/L (ref 0–40)
Albumin/Globulin Ratio: 2 (ref 1.2–2.2)
Albumin: 4.4 g/dL (ref 3.8–4.8)
Alkaline Phosphatase: 62 IU/L (ref 44–121)
BUN/Creatinine Ratio: 13 (ref 10–24)
BUN: 14 mg/dL (ref 8–27)
Bilirubin Total: 0.5 mg/dL (ref 0.0–1.2)
CO2: 27 mmol/L (ref 20–29)
Calcium: 9.3 mg/dL (ref 8.6–10.2)
Chloride: 101 mmol/L (ref 96–106)
Creatinine, Ser: 1.08 mg/dL (ref 0.76–1.27)
Globulin, Total: 2.2 g/dL (ref 1.5–4.5)
Glucose: 97 mg/dL (ref 70–99)
Potassium: 4.9 mmol/L (ref 3.5–5.2)
Sodium: 143 mmol/L (ref 134–144)
Total Protein: 6.6 g/dL (ref 6.0–8.5)
eGFR: 71 mL/min/{1.73_m2} (ref >59)

## 2022-10-14 LAB — LIPID PANEL WITH LDL/HDL RATIO
Cholesterol, Total: 189 mg/dL (ref 100–199)
HDL: 45 mg/dL (ref >39)
LDL Chol Calc (NIH): 109 mg/dL — ABNORMAL HIGH (ref 0–99)
LDL/HDL Ratio: 2.4 ratio (ref 0.0–3.6)
Triglycerides: 199 mg/dL — ABNORMAL HIGH (ref 0–149)
VLDL Cholesterol Cal: 35 mg/dL (ref 5–40)

## 2022-12-03 ENCOUNTER — Other Ambulatory Visit: Payer: Self-pay | Admitting: Urology

## 2023-02-03 ENCOUNTER — Other Ambulatory Visit: Payer: Medicare Other

## 2023-02-10 ENCOUNTER — Other Ambulatory Visit: Payer: Medicare Other

## 2023-02-10 DIAGNOSIS — R972 Elevated prostate specific antigen [PSA]: Secondary | ICD-10-CM

## 2023-02-11 LAB — PSA: Prostate Specific Ag, Serum: 3.1 ng/mL (ref 0.0–4.0)

## 2023-03-03 ENCOUNTER — Ambulatory Visit: Payer: Medicare Other | Admitting: Family Medicine

## 2023-05-27 ENCOUNTER — Ambulatory Visit (INDEPENDENT_AMBULATORY_CARE_PROVIDER_SITE_OTHER): Payer: Medicare Other

## 2023-05-27 VITALS — BP 120/60 | Ht 68.0 in | Wt 204.4 lb

## 2023-05-27 DIAGNOSIS — Z0000 Encounter for general adult medical examination without abnormal findings: Secondary | ICD-10-CM | POA: Diagnosis not present

## 2023-05-27 NOTE — Progress Notes (Signed)
Subjective:   Noah Ayala is a 77 y.o. male who presents for Medicare Annual/Subsequent preventive examination.  Visit Complete: In person   Review of Systems     Cardiac Risk Factors include: advanced age (>48men, >24 women);male gender     Objective:    Today's Vitals   05/27/23 0928  BP: 120/60  Weight: 204 lb 6.4 oz (92.7 kg)  Height: 5\' 8"  (1.727 m)   Body mass index is 31.08 kg/m.     05/27/2023    9:40 AM 06/10/2021    7:14 PM  Advanced Directives  Does Patient Have a Medical Advance Directive? No No  Would patient like information on creating a medical advance directive? No - Patient declined     Current Medications (verified) Outpatient Encounter Medications as of 05/27/2023  Medication Sig   cholecalciferol (VITAMIN D3) 25 MCG (1000 UNIT) tablet Take 5,000 Units by mouth daily.   finasteride (PROSCAR) 5 MG tablet TAKE 1 TABLET (5 MG TOTAL) BY MOUTH DAILY.   hydrochlorothiazide (HYDRODIURIL) 12.5 MG tablet Take 1 tablet (12.5 mg total) by mouth daily.   losartan (COZAAR) 50 MG tablet Take 1 tablet (50 mg total) by mouth daily.   simvastatin (ZOCOR) 40 MG tablet Take 1 tablet (40 mg total) by mouth at bedtime. 1/2 tablet daily   tadalafil (CIALIS) 20 MG tablet Take 1 tablet 1 hour prior to intercourse.   mirabegron ER (MYRBETRIQ) 25 MG TB24 tablet Take 1 tablet (25 mg total) by mouth daily. (Patient not taking: Reported on 10/13/2022)   No facility-administered encounter medications on file as of 05/27/2023.    Allergies (verified) Patient has no known allergies.   History: Past Medical History:  Diagnosis Date   Hypertension    No past surgical history on file. No family history on file. Social History   Socioeconomic History   Marital status: Married    Spouse name: Noah Ayala,Noah Ayala   Number of children: 3   Years of education: Not on file   Highest education level: Not on file  Occupational History   Occupation: Retired  Tobacco Use   Smoking  status: Never   Smokeless tobacco: Never  Vaping Use   Vaping status: Never Used  Substance and Sexual Activity   Alcohol use: Not Currently   Drug use: Not Currently   Sexual activity: Yes  Other Topics Concern   Not on file  Social History Narrative   Not on file   Social Determinants of Health   Financial Resource Strain: Low Risk  (05/27/2023)   Overall Financial Resource Strain (CARDIA)    Difficulty of Paying Living Expenses: Not hard at all  Food Insecurity: No Food Insecurity (05/27/2023)   Hunger Vital Sign    Worried About Running Out of Food in the Last Year: Never true    Ran Out of Food in the Last Year: Never true  Transportation Needs: No Transportation Needs (05/27/2023)   PRAPARE - Administrator, Civil Service (Medical): No    Lack of Transportation (Non-Medical): No  Physical Activity: Insufficiently Active (05/27/2023)   Exercise Vital Sign    Days of Exercise per Week: 3 days    Minutes of Exercise per Session: 30 min  Stress: No Stress Concern Present (05/27/2023)   [employer] of Occupational Health - Occupational Stress Questionnaire    Feeling of Stress : Only a little  Social Connections: Moderately Integrated (05/27/2023)   Social Connection and Isolation Panel [NHANES]    Frequency of  Communication with Friends and Family: More than three times a week    Frequency of Social Gatherings with Friends and Family: Three times a week    Attends Religious Services: More than 4 times per year    Active Member of Clubs or Organizations: No    Attends Banker Meetings: Never    Marital Status: Married    Tobacco Counseling Counseling given: Not Answered   Clinical Intake:  Pre-visit preparation completed: Yes  Pain : No/denies pain     Nutritional Status: BMI > 30  Obese Nutritional Risks: None Diabetes: No  How often do you need to have someone help you when you read instructions, pamphlets, or other written materials  from your doctor or pharmacy?: 1 - Never  Interpreter Needed?: No  Information entered by :: Kennedy Bucker, LPN   Activities of Daily Living    05/27/2023    9:41 AM  In your present state of health, do you have any difficulty performing the following activities:  Hearing? 0  Vision? 0  Difficulty concentrating or making decisions? 0  Walking or climbing stairs? 0  Dressing or bathing? 0  Doing errands, shopping? 0  Preparing Food and eating ? N  Using the Toilet? N  In the past six months, have you accidently leaked urine? N  Do you have problems with loss of bowel control? N  Managing your Medications? N  Managing your Finances? N  Housekeeping or managing your Housekeeping? N    Patient Care Team: Duanne Limerick, MD as PCP - General (Family Medicine)  Indicate any recent Medical Services you may have received from other than [facility] providers in the past year (date may be approximate).     Assessment:   This is a routine wellness examination for Noah Ayala.  Hearing/Vision screen Hearing Screening - Comments:: No aids Vision Screening - Comments:: Wears glasses- [employer] in [facility]  Dietary issues and exercise activities discussed:     Goals Addressed             This Visit's Progress    DIET - EAT MORE FRUITS AND VEGETABLES         Depression Screen    05/27/2023    9:37 AM 10/13/2022   10:02 AM 09/01/2022    3:37 PM 05/19/2022    9:28 AM 05/16/2022   11:15 AM 04/08/2022    1:39 PM 04/03/2022    3:40 PM  PHQ 2/9 Scores  PHQ - 2 Score 0 0 0 0 0 0 0  PHQ- 9 Score 0 0 0 1 0 2 2    Fall Risk    05/27/2023    9:41 AM 10/13/2022   10:01 AM 09/01/2022    3:37 PM 05/19/2022    9:27 AM 05/16/2022   11:19 AM  Fall Risk   Falls in the past year? 0 0 0 0 0  Number falls in past yr: 0 0 0 0 0  Injury with Fall? 0 0 0 0 0  Risk for fall due to : No Fall Risks No Fall Risks No Fall Risks No Fall Risks No Fall Risks  Follow up Falls prevention  discussed;Falls evaluation completed Falls evaluation completed Falls evaluation completed Falls evaluation completed Falls evaluation completed    MEDICARE RISK AT HOME: Medicare Risk at Home Any stairs in or around the home?: Yes If so, are there any without handrails?: No Home free of loose throw rugs in walkways, pet beds, electrical cords,  etc?: Yes Adequate lighting in your home to reduce risk of falls?: Yes Life alert?: No Use of a cane, walker or w/c?: No Grab bars in the bathroom?: No Shower chair or bench in shower?: No Elevated toilet seat or a handicapped toilet?: No  TIMED UP AND GO:  Was the test performed?  Yes  Length of time to ambulate 10 feet: 4 sec Gait steady and fast without use of assistive device    Cognitive Function:        05/27/2023    9:42 AM 05/16/2022   11:25 AM  6CIT Screen  What Year? 0 points 0 points  What month? 0 points 0 points  What time? 0 points 0 points  Count back from 20 0 points 0 points  Months in reverse 0 points 0 points  Repeat phrase 0 points 0 points  Total Score 0 points 0 points    Immunizations Immunization History  Administered Date(s) Administered   Influenza-Unspecified 06/22/2014, 07/09/2017, 07/21/2020, 06/23/2022   PFIZER Comirnaty(Gray Top)Covid-19 Tri-Sucrose Vaccine 12/19/2020   PFIZER(Purple Top)SARS-COV-2 Vaccination 10/16/2019, 11/12/2019, 05/09/2020   PNEUMOCOCCAL CONJUGATE-20 01/12/2012   Pneumococcal Conjugate-13 12/15/2013, 06/06/2014, 05/06/2019   Tdap 06/10/2021   Zoster Recombinant(Shingrix) 01/30/2021   Zoster, Live 01/12/2012    TDAP status: Up to date  Flu Vaccine status: Declined, Education has been provided regarding the importance of this vaccine but patient still declined. Advised may receive this vaccine at local pharmacy or Health Dept. Aware to provide a copy of the vaccination record if obtained from local pharmacy or Health Dept. Verbalized acceptance and  understanding.  Pneumococcal vaccine status: Up to date  Covid-19 vaccine status: Completed vaccines  Qualifies for Shingles Vaccine? Yes   Zostavax completed No   Shingrix Completed?: No.    Education has been provided regarding the importance of this vaccine. Patient has been advised to call insurance company to determine out of pocket expense if they have not yet received this vaccine. Advised may also receive vaccine at local pharmacy or Health Dept. Verbalized acceptance and understanding.  Screening Tests Health Maintenance  Topic Date Due   Hepatitis C Screening  Never done   Zoster Vaccines- Shingrix (2 of 2) 03/27/2021   INFLUENZA VACCINE  04/23/2023   COVID-19 Vaccine (5 - 2023-24 season) 05/24/2023   Medicare Annual Wellness (AWV)  05/26/2024   DTaP/Tdap/Td (2 - Td or Tdap) 06/11/2031   Pneumonia Vaccine 17+ Years old  Completed   HPV VACCINES  Aged Out    Health Maintenance  Health Maintenance Due  Topic Date Due   Hepatitis C Screening  Never done   Zoster Vaccines- Shingrix (2 of 2) 03/27/2021   INFLUENZA VACCINE  04/23/2023   COVID-19 Vaccine (5 - 2023-24 season) 05/24/2023    Colorectal cancer screening: No longer required.   Lung Cancer Screening: (Low Dose CT Chest recommended if Age 41-80 years, 20 pack-year currently smoking OR have quit w/in 15years.) does not qualify.    Additional Screening:  Hepatitis C Screening: does qualify; Completed no  Vision Screening: Recommended annual ophthalmology exams for early detection of glaucoma and other disorders of the eye. Is the patient up to date with their annual eye exam?  Yes  Who is the provider or what is the name of the office in which the patient attends annual eye exams? [facility]  If pt is not established with a provider, would they like to be referred to a provider to establish care? No .   Dental Screening: Recommended  annual dental exams for proper oral hygiene   Community Resource Referral /  Chronic Care Management: CRR required this visit?  No   CCM required this visit?  No     Plan:     I have personally reviewed and noted the following in the patient's chart:   Medical and social history Use of alcohol, tobacco or illicit drugs  Current medications and supplements including opioid prescriptions. Patient is not currently taking opioid prescriptions. Functional ability and status Nutritional status Physical activity Advanced directives List of other physicians Hospitalizations, surgeries, and ER visits in previous 12 months Vitals Screenings to include cognitive, depression, and falls Referrals and appointments  In addition, I have reviewed and discussed with patient certain preventive protocols, quality metrics, and best practice recommendations. A written personalized care plan for preventive services as well as general preventive health recommendations were provided to patient.     Hal Hope, LPN   09/27/1094   After Visit Summary: my chart  Nurse Notes: none

## 2023-05-27 NOTE — Patient Instructions (Addendum)
Noah Ayala , Thank you for taking time to come for your Medicare Wellness Visit. I appreciate your ongoing commitment to your health goals. Please review the following plan we discussed and let me know if I can assist you in the future.   Referrals/Orders/Follow-Ups/Clinician Recommendations: none  This is a list of the screening recommended for you and due dates:  Health Maintenance  Topic Date Due   Hepatitis C Screening  Never done   Zoster (Shingles) Vaccine (2 of 2) 03/27/2021   Flu Shot  04/23/2023   COVID-19 Vaccine (5 - 2023-24 season) 05/24/2023   Medicare Annual Wellness Visit  05/26/2024   DTaP/Tdap/Td vaccine (2 - Td or Tdap) 06/11/2031   Pneumonia Vaccine  Completed   HPV Vaccine  Aged Out    Advanced directives: (ACP Link)Information on Advanced Care Planning can be found at [facility] of [place] Advance Health Care Directives Advance Health Care Directives ([email or web address]   Next Medicare Annual Wellness Visit scheduled for next year: Yes   06/01/24 @ 8:45 am in person

## 2023-06-04 ENCOUNTER — Ambulatory Visit
Admission: EM | Admit: 2023-06-04 | Discharge: 2023-06-04 | Disposition: A | Discharge status: Home / Self Care | Payer: Medicare Other | Attending: Emergency Medicine | Admitting: Emergency Medicine

## 2023-06-04 ENCOUNTER — Ambulatory Visit (INDEPENDENT_AMBULATORY_CARE_PROVIDER_SITE_OTHER): Payer: Medicare Other

## 2023-06-04 ENCOUNTER — Telehealth: Payer: Self-pay | Admitting: Emergency Medicine

## 2023-06-04 DIAGNOSIS — N201 Calculus of ureter: Secondary | ICD-10-CM | POA: Insufficient documentation

## 2023-06-04 LAB — CBC WITH DIFFERENTIAL/PLATELET
Abs Immature Granulocytes: 0.05 10*3/uL (ref 0.00–0.07)
Basophils Absolute: 0 10*3/uL (ref 0.0–0.1)
Basophils Relative: 0 %
Eosinophils Absolute: 0 10*3/uL (ref 0.0–0.5)
Eosinophils Relative: 0 %
HCT: 44.7 % (ref 39.0–52.0)
Hemoglobin: 14.7 g/dL (ref 13.0–17.0)
Immature Granulocytes: 0 %
Lymphocytes Relative: 10 %
Lymphs Abs: 1.7 10*3/uL (ref 0.7–4.0)
MCH: 28.3 pg (ref 26.0–34.0)
MCHC: 32.9 g/dL (ref 30.0–36.0)
MCV: 86 fL (ref 80.0–100.0)
Monocytes Absolute: 1.1 10*3/uL — ABNORMAL HIGH (ref 0.1–1.0)
Monocytes Relative: 6 %
Neutro Abs: 13.6 10*3/uL — ABNORMAL HIGH (ref 1.7–7.7)
Neutrophils Relative %: 84 %
Platelets: 257 10*3/uL (ref 150–400)
RBC: 5.2 MIL/uL (ref 4.22–5.81)
RDW: 13.6 % (ref 11.5–15.5)
WBC: 16.4 10*3/uL — ABNORMAL HIGH (ref 4.0–10.5)
nRBC: 0 % (ref 0.0–0.2)

## 2023-06-04 LAB — URINALYSIS, W/ REFLEX TO CULTURE (INFECTION SUSPECTED)
Bilirubin Urine: NEGATIVE
Glucose, UA: NEGATIVE mg/dL
Ketones, ur: 15 mg/dL — AB
Leukocytes,Ua: NEGATIVE
Nitrite: NEGATIVE
Protein, ur: NEGATIVE mg/dL
RBC / HPF: >50 RBC/hpf (ref 0–5)
Specific Gravity, Urine: 1.025 (ref 1.005–1.030)
Squamous Epithelial / HPF: NONE SEEN /HPF (ref 0–5)
pH: 6.5 (ref 5.0–8.0)

## 2023-06-04 LAB — COMPREHENSIVE METABOLIC PANEL
ALT: 17 U/L (ref 0–44)
AST: 20 U/L (ref 15–41)
Albumin: 4.1 g/dL (ref 3.5–5.0)
Alkaline Phosphatase: 52 U/L (ref 38–126)
Anion gap: 10 (ref 5–15)
BUN: 19 mg/dL (ref 8–23)
CO2: 24 mmol/L (ref 22–32)
Calcium: 8.8 mg/dL — ABNORMAL LOW (ref 8.9–10.3)
Chloride: 102 mmol/L (ref 98–111)
Creatinine, Ser: 1.13 mg/dL (ref 0.61–1.24)
GFR, Estimated: >60 mL/min (ref >60)
Glucose, Bld: 153 mg/dL — ABNORMAL HIGH (ref 70–99)
Potassium: 3.8 mmol/L (ref 3.5–5.1)
Sodium: 136 mmol/L (ref 135–145)
Total Bilirubin: 0.5 mg/dL (ref 0.3–1.2)
Total Protein: 6.9 g/dL (ref 6.5–8.1)

## 2023-06-04 MED ORDER — TAMSULOSIN HCL 0.4 MG PO CAPS
0.4000 mg | ORAL_CAPSULE | Freq: Every day | ORAL | 0 refills | Status: DC
Start: 2023-06-04 — End: 2023-06-26

## 2023-06-04 MED ORDER — IBUPROFEN 800 MG PO TABS: 800.0000 mg | ORAL_TABLET | Freq: Once | ORAL | Status: DC

## 2023-06-04 MED ORDER — KETOROLAC TROMETHAMINE 10 MG PO TABS: 10.0000 mg | ORAL_TABLET | Freq: Four times a day (QID) | ORAL | 0 refills | Status: DC | PRN

## 2023-06-04 MED ORDER — ONDANSETRON 8 MG PO TBDP: 8.0000 mg | ORAL_TABLET | Freq: Three times a day (TID) | ORAL | 0 refills | Status: DC | PRN

## 2023-06-04 MED ORDER — KETOROLAC TROMETHAMINE 30 MG/ML IJ SOLN
30.0000 mg | Freq: Once | INTRAMUSCULAR | Status: AC
  Administered 2023-06-04: 30 mg via INTRAMUSCULAR

## 2023-06-04 NOTE — ED Provider Notes (Signed)
[facility]    CSN: [id number] Arrival date & time: 06/04/23  0808      History   Chief Complaint Chief Complaint  Patient presents with   Abdominal Pain    HPI Noah Ayala is a 77 y.o. male.   HPI  77 year old male with a past medical history significant for hypertension presents for evaluation of sudden onset of left flank/left lower quadrant abdominal pain that started at 0 100 this morning.  He is currently rating the pain as a 9/10 and it does have associated nausea and dry heaving.  He has had renal stones in the past but not for the past 25 years.  He denies any history of diverticulitis or diverticulosis.  His last colonoscopy was greater than 10 years ago and the results not available in epic.  The pain has not moved and is not associate with fever, pain with urination, or blood in the urine.  Last normal bowel movement was yesterday and was normal in form and color.  No blood.  Past Medical History:  Diagnosis Date   Hypertension     There are no problems to display for this patient.   History reviewed. No pertinent surgical history.     Home Medications    Prior to Admission medications   Medication Sig Start Date End Date Taking? Authorizing Provider  cholecalciferol (VITAMIN D3) 25 MCG (1000 UNIT) tablet Take 5,000 Units by mouth daily.   Yes [provider]  finasteride (PROSCAR) 5 MG tablet TAKE 1 TABLET (5 MG TOTAL) BY MOUTH DAILY. 12/03/22  Yes Stoioff, Verna Czech, MD  hydrochlorothiazide (HYDRODIURIL) 12.5 MG tablet Take 1 tablet (12.5 mg total) by mouth daily. 10/13/22  Yes Duanne Limerick, MD  losartan (COZAAR) 50 MG tablet Take 1 tablet (50 mg total) by mouth daily. 10/13/22  Yes Duanne Limerick, MD  mirabegron ER (MYRBETRIQ) 25 MG TB24 tablet Take 1 tablet (25 mg total) by mouth daily. 07/25/22  Yes Stoioff, Verna Czech, MD  ondansetron (ZOFRAN-ODT) 8 MG disintegrating tablet Take 1 tablet (8 mg total) by mouth every 8 (eight) hours as  needed for nausea or vomiting. 06/04/23  Yes Becky Augusta, NP  simvastatin (ZOCOR) 40 MG tablet Take 1 tablet (40 mg total) by mouth at bedtime. 1/2 tablet daily 10/13/22  Yes Duanne Limerick, MD  tadalafil (CIALIS) 20 MG tablet Take 1 tablet 1 hour prior to intercourse. 07/25/22  Yes Stoioff, Verna Czech, MD  tamsulosin (FLOMAX) 0.4 MG CAPS capsule Take 1 capsule (0.4 mg total) by mouth daily. 06/04/23  Yes Becky Augusta, NP    Family History History reviewed. No pertinent family history.  Social History Social History   Tobacco Use   Smoking status: Never   Smokeless tobacco: Never  Vaping Use   Vaping status: Never Used  Substance Use Topics   Alcohol use: Not Currently   Drug use: Not Currently     Allergies   Patient has no known allergies.   Review of Systems Review of Systems  Constitutional:  Negative for fever.  Gastrointestinal:  Positive for abdominal pain and nausea. Negative for blood in stool, constipation, diarrhea and vomiting.  Genitourinary:  Negative for dysuria and hematuria.     Physical Exam Triage Vital Signs ED Triage Vitals [06/04/23 0835]  Encounter Vitals Group     BP      Systolic BP Percentile      Diastolic BP Percentile      Pulse  Resp      Temp      Temp src      SpO2      Weight 200 lb (90.7 kg)     Height 5\' 8"  (1.727 m)     Head Circumference      Peak Flow      Pain Score 8     Pain Loc      Pain Education      Exclude from Growth Chart    No data found.  Updated Vital Signs BP (!) 154/90 (BP Location: Left Arm)   Pulse 73   Temp 98.3 F (36.8 C) (Oral)   Ht 5\' 8"  (1.727 m)   Wt 200 lb (90.7 kg)   SpO2 98%   BMI 30.41 kg/m   Visual Acuity Right Eye Distance:   Left Eye Distance:   Bilateral Distance:    Right Eye Near:   Left Eye Near:    Bilateral Near:     Physical Exam Vitals and nursing note reviewed.  Constitutional:      Appearance: Normal appearance. He is not ill-appearing.  HENT:     Head:  Normocephalic and atraumatic.  Cardiovascular:     Rate and Rhythm: Normal rate and regular rhythm.     Pulses: Normal pulses.     Heart sounds: Normal heart sounds. No murmur heard.    No friction rub. No gallop.  Pulmonary:     Effort: Pulmonary effort is normal.     Breath sounds: Normal breath sounds. No wheezing, rhonchi or rales.  Abdominal:     General: Abdomen is flat.     Palpations: Abdomen is soft.     Tenderness: There is abdominal tenderness. There is left CVA tenderness. There is no right CVA tenderness, guarding or rebound.     Comments: Patient has tenderness in the left side of the abdomen without guarding or rebound.  He also has left-sided CVA tenderness.  Skin:    General: Skin is warm.     Capillary Refill: Capillary refill takes less than 2 seconds.  Neurological:     General: No focal deficit present.     Mental Status: He is alert and oriented to person, place, and time.      UC Treatments / Results  Labs (all labs ordered are listed, but only abnormal results are displayed) Labs Reviewed  CBC WITH DIFFERENTIAL/PLATELET - Abnormal; Notable for the following components:      Result Value   WBC 16.4 (*)    Neutro Abs 13.6 (*)    Monocytes Absolute 1.1 (*)    All other components within normal limits  COMPREHENSIVE METABOLIC PANEL - Abnormal; Notable for the following components:   Glucose, Bld 153 (*)    Calcium 8.8 (*)    All other components within normal limits  URINALYSIS, W/ REFLEX TO CULTURE (INFECTION SUSPECTED) - Abnormal; Notable for the following components:   Hgb urine dipstick MODERATE (*)    Ketones, ur 15 (*)    Bacteria, UA RARE (*)    All other components within normal limits    EKG   Radiology CT Renal Stone Study  Result Date: 06/04/2023 CLINICAL DATA:  Left flank pain and vomiting. EXAM: CT ABDOMEN AND PELVIS WITHOUT CONTRAST TECHNIQUE: Multidetector CT imaging of the abdomen and pelvis was performed following the standard  protocol without IV contrast. RADIATION DOSE REDUCTION: This exam was performed according to the departmental dose-optimization program which includes automated exposure control, adjustment of  the mA and/or kV according to patient size and/or use of iterative reconstruction technique. COMPARISON:  01/01/2022 FINDINGS: Lower chest: Unremarkable. Hepatobiliary: Stable left hepatic cyst. No followup imaging is recommended. There is no evidence for gallstones, gallbladder wall thickening, or pericholecystic fluid. No intrahepatic or extrahepatic biliary dilation. Pancreas: No focal mass lesion. No dilatation of the main duct. No intraparenchymal cyst. No peripancreatic edema. Spleen: No splenomegaly. No suspicious focal mass lesion. Adrenals/Urinary Tract: No adrenal nodule or mass. Right kidney and ureter unremarkable. There is left renal edema with perinephric stranding and mild left hydronephrosis. These changes are secondary to the presence of a 4 x 4 x 6 mm stone in the proximal left ureter (axial 44/2 and coronal 56/4). Mid and distal left ureter unremarkable. Bladder decompressed. Stomach/Bowel: Stomach is decompressed. Duodenum is normally positioned as is the ligament of Treitz. No small bowel wall thickening. No small bowel dilatation. The terminal ileum is normal. The appendix is normal. No gross colonic mass. No colonic wall thickening. Diverticular changes are noted in the left colon without evidence of diverticulitis. Vascular/Lymphatic: There is mild atherosclerotic calcification of the abdominal aorta without aneurysm. There is no gastrohepatic or hepatoduodenal ligament lymphadenopathy. No retroperitoneal or mesenteric lymphadenopathy. No pelvic sidewall lymphadenopathy. Reproductive: Prostate gland is enlarged. Other: No intraperitoneal free fluid. Musculoskeletal: Fixation hardware noted right pelvis. Degenerative changes are noted in the right hip. No worrisome lytic or sclerotic osseous abnormality.  Anterior wedge compression deformity at L1 is stable. IMPRESSION: 1. 4 x 4 x 6 mm stone in the proximal left ureter with mild left hydronephrosis and left renal edema. 2. Left colonic diverticulosis without diverticulitis. 3. Prostatomegaly. 4.  Aortic Atherosclerosis (ICD10-I70.0). Electronically Signed   By: Kennith Center M.D.   On: 06/04/2023 10:20    Procedures Procedures (including critical care time)  Medications Ordered in UC Medications  ketorolac (TORADOL) 30 MG/ML injection 30 mg (30 mg Intramuscular Given 06/04/23 0940)    Initial Impression / Assessment and Plan / UC Course  I have reviewed the triage vital signs and the nursing notes.  Pertinent labs & imaging results that were available during my care of the patient were reviewed by me and considered in my medical decision making (see chart for details).   Patient is a pleasant 77 year old male who does appear to be in a moderate degree of pain presenting for evaluation of left flank/left abdomen pain that began at 0 100 this morning and is still present.  It is a 9/10 and does not radiate.  On exam the patient does have CVA tenderness as well as pain in the left side of his abdomen without focal findings.  No guarding or rebound.  No known history of diverticulitis or diverticulosis but his last colonoscopy was greater than 10 years ago at the [facility] center in [place] and those results not available in epic.  He does have a previous history of renal stones but not the last 25 years.  Given his left-sided abdominal pain as well as CVA tenderness I do suspect that this is possibly a stone causing his symptoms so I will order a CBC, CMP, UA, and CT abdomen pelvis for stone study.  I will also order 30 mg of IM Toradol for pain.  CBC has an elevated white count of 16.4 with an elevated neutrophil number of 13.6 and elevated monocytes at 1.1.  Otherwise it is unremarkable.  Urinalysis shows moderate hemoglobin with 15 ketones.   Negative for leukocyte esterase, nitrates, or  protein.  Reflex microscopy shows greater than 50 RBCs and rare bacteria.  CMP shows an elevated glucose of 153 but electrolytes, renal function, and majority the transaminases are normal.  Total protein and AST are listed as pending.  CT impression states there is a 4 x 4 x 6 mm stone in the proximal left ureter with with left renal edema, perinephric stranding, and left hydronephrosis.  Also left diverticulosis without evidence of diverticulitis.  I will discharge patient home with diagnosis of left renal calculi and put in a urgent referral to urology.  I will start him on tamsulosin 0.4 mg daily and have him strain his urine to see if he can capture the stone.  Additionally I will discharge him home on Toradol every 6 hours as needed for pain along with Zofran every 8 hours as needed for nausea and vomiting.   Final Clinical Impressions(s) / UC Diagnoses   Final diagnoses:  Left ureteral stone     Discharge Instructions      Your CT today demonstrated that you have a stone in the proximal part of your left ureter.  Given its dimensions it may not pass on its own.  Take the tamsulosin 0.4 mg daily to help dilate your ureter and see if he can help the stone pass.  I do need you to strain all urine to see if you capture stone.  I am going to prescribe Toradol 10 mg tablets for you that you can take every 6 hours as needed for pain.  Do not take more than 5 days.  I have also placed an urgent referral to urology for evaluation of your stone and to discuss available options.  If you develop any worsening pain, nausea or vomiting and cannot keep down fluids, or fever you need to go to the ER for evaluation.     ED Prescriptions     Medication Sig Dispense Auth. Provider   tamsulosin (FLOMAX) 0.4 MG CAPS capsule Take 1 capsule (0.4 mg total) by mouth daily. 30 capsule Becky Augusta, NP   ondansetron (ZOFRAN-ODT) 8 MG disintegrating tablet  Take 1 tablet (8 mg total) by mouth every 8 (eight) hours as needed for nausea or vomiting. 20 tablet Becky Augusta, NP      PDMP not reviewed this encounter.   Becky Augusta, NP 06/04/23 1030

## 2023-06-04 NOTE — Telephone Encounter (Signed)
This prescription was accidentally missed during the patient's visit earlier today.  Prescription sent to pharmacy.

## 2023-06-04 NOTE — Discharge Instructions (Addendum)
Your CT today demonstrated that you have a stone in the proximal part of your left ureter.  Given its dimensions it may not pass on its own.  Take the tamsulosin 0.4 mg daily to help dilate your ureter and see if he can help the stone pass.  I do need you to strain all urine to see if you capture stone.  I am going to prescribe Toradol 10 mg tablets for you that you can take every 6 hours as needed for pain.  Do not take more than 5 days.  I have also placed an urgent referral to urology for evaluation of your stone and to discuss available options.  If you develop any worsening pain, nausea or vomiting and cannot keep down fluids, or fever you need to go to the ER for evaluation.

## 2023-06-04 NOTE — ED Triage Notes (Signed)
Pt c/o abdominal pain  Pt states that he got up to use the restroom and began to have lower left abdominal pain.   Pt denies any new physical activities or actions.  Pt denies eating any new foods  Pt last bowel movement was yesterday and was complete. Pt states that it was normal in color.   Pt denies any urge to go now or gas.   Pt states that his stomach was tender to touch this morning but is not anymore.

## 2023-06-06 ENCOUNTER — Other Ambulatory Visit: Payer: Self-pay | Admitting: Family Medicine

## 2023-06-06 DIAGNOSIS — I10 Essential (primary) hypertension: Secondary | ICD-10-CM

## 2023-06-08 NOTE — Telephone Encounter (Signed)
Courtesy refill. Patient will need an office visit for further refills. Requested Prescriptions  Pending Prescriptions Disp Refills   hydrochlorothiazide (HYDRODIURIL) 12.5 MG tablet [Pharmacy Med Name: HYDROCHLOROTHIAZIDE 12.5 MG TB] 30 tablet 0    Sig: TAKE 1 TABLET BY MOUTH EVERY DAY     Cardiovascular: Diuretics - Thiazide Failed - 06/06/2023  8:43 AM      Failed - Last BP in normal range    BP Readings from Last 1 Encounters:  06/04/23 (!) 154/90         Passed - Cr in normal range and within 180 days    Creatinine, Ser  Date Value Ref Range Status  06/04/2023 1.13 0.61 - 1.24 mg/dL Final         Passed - K in normal range and within 180 days    Potassium  Date Value Ref Range Status  06/04/2023 3.8 3.5 - 5.1 mmol/L Final         Passed - Na in normal range and within 180 days    Sodium  Date Value Ref Range Status  06/04/2023 136 135 - 145 mmol/L Final  10/13/2022 143 134 - 144 mmol/L Final         Passed - Valid encounter within last 6 months    Recent Outpatient Visits           7 months ago Primary hypertension   [facility]   9 months ago Primary hypertension   [facility]   1 year ago Benign paroxysmal positional vertigo, unspecified laterality   [facility], [facility]   1 year ago Nonexertional chest pain   [facility]   1 year ago Primary hypertension   [facility], [facility]       Future Appointments             In 1 month Stoioff, Verna Czech, MD [facility] [place]

## 2023-06-08 NOTE — Telephone Encounter (Signed)
Called pt - Left message on machine to return call and schedule OV.

## 2023-06-22 ENCOUNTER — Other Ambulatory Visit: Payer: Self-pay | Admitting: *Deleted

## 2023-06-22 DIAGNOSIS — N200 Calculus of kidney: Secondary | ICD-10-CM

## 2023-06-26 ENCOUNTER — Ambulatory Visit
Admission: RE | Admit: 2023-06-26 | Discharge: 2023-06-26 | Disposition: A | Discharge status: Home / Self Care | Payer: Medicare Other | Attending: Physician Assistant | Admitting: *Deleted

## 2023-06-26 ENCOUNTER — Other Ambulatory Visit: Payer: Self-pay

## 2023-06-26 ENCOUNTER — Encounter: Payer: Self-pay | Admitting: Physician Assistant

## 2023-06-26 ENCOUNTER — Ambulatory Visit: Payer: Medicare Other | Admitting: Physician Assistant

## 2023-06-26 ENCOUNTER — Ambulatory Visit
Admission: RE | Admit: 2023-06-26 | Discharge: 2023-06-26 | Disposition: A | Discharge status: Home / Self Care | Payer: Medicare Other | Source: Ambulatory Visit | Attending: Physician Assistant | Admitting: Physician Assistant

## 2023-06-26 VITALS — BP 116/70 | HR 85 | Ht 68.0 in | Wt 200.0 lb

## 2023-06-26 DIAGNOSIS — N201 Calculus of ureter: Secondary | ICD-10-CM

## 2023-06-26 DIAGNOSIS — N200 Calculus of kidney: Secondary | ICD-10-CM

## 2023-06-26 LAB — URINALYSIS, COMPLETE
Bilirubin, UA: NEGATIVE
Glucose, UA: NEGATIVE
Ketones, UA: NEGATIVE
Leukocytes,UA: NEGATIVE
Nitrite, UA: NEGATIVE
Protein,UA: NEGATIVE
Specific Gravity, UA: 1.025 (ref 1.005–1.030)
Urobilinogen, Ur: 0.2 mg/dL (ref 0.2–1.0)
pH, UA: 5.5 (ref 5.0–7.5)

## 2023-06-26 LAB — MICROSCOPIC EXAMINATION

## 2023-06-26 MED ORDER — TAMSULOSIN HCL 0.4 MG PO CAPS: 0.4000 mg | ORAL_CAPSULE | Freq: Every day | ORAL | 0 refills | Status: DC

## 2023-06-26 MED ORDER — OXYCODONE-ACETAMINOPHEN 5-325 MG PO TABS
1.0000 | ORAL_TABLET | ORAL | 0 refills | Status: AC | PRN
Start: 2023-06-26 — End: 2023-07-01

## 2023-06-26 MED ORDER — ONDANSETRON 8 MG PO TBDP
8.0000 mg | ORAL_TABLET | Freq: Three times a day (TID) | ORAL | 0 refills | Status: DC | PRN
Start: 2023-06-26 — End: 2024-03-03

## 2023-06-26 NOTE — Patient Instructions (Addendum)
We're planning for shockwave lithotripsy on your left ureteral stone next Thursday, 07/02/2023. In the meantime, please do the following: -Take Flomax (tamsulosin) 0.4mg  daily -Treat any pain with Advil (ibuprofen), Tylenol (acetaminophen), Toradol (ketorolac), or Percocet (oxycodone-acetaminophen). DO NOT TAKE ADVIL (IBUPROFEN) OR TORADOL (KETOROLAC) AFTER MONDAY, 10/7. -Treat any nausea with Zofran as prescribed today  Please call our office immediately (we are open 8a-5p Monday-Friday) or go to the Emergency Department if you develop any of the following: -Fever/chills -Nausea and/or vomiting uncontrollable with Zofran -Pain uncontrollable with Percocet

## 2023-06-26 NOTE — Progress Notes (Signed)
    [facility] ESWL POSTING SHEET        Patient Name: Noah Ayala  DOB: 19-Feb-1946  MRN: [id number]  Surgeon:  Irineo Axon, MD  Diagnosis:  Left Ureteral Stone  CPT: 78295  ESWL DATE: 07/02/2023  ESWL TIME: 0845am. 7am Arrival  Special Needs/Requirements: no       Cardiac/Medical/Pulmonary Clearance needed: no       Form Faxed to [facility]- [phone] Date:   Date: 06/26/23       Form Faxed to [place]- [phone]  Date:  Date: 06/26/23           Copy Made for Insurance PA:  Date: 06/26/23       Orders Entered in to Epic:  Date: 06/26/23

## 2023-06-26 NOTE — H&P (View-Only) (Signed)
06/26/2023 9:30 AM   Noah Ayala 11-09-1945 [id number]  CC: Chief Complaint  Patient presents with   Follow-up   HPI: Noah Ayala is a 77 y.o. 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   He was seen at [facility] on 06/04/2023 with reports of severe left flank and LLQ pain with nausea and dry heaves.  UA with no nitrates, no pyuria, >50 RBC/hpf, and rare bacteria.  CT stone study showed a 4 x 4 x 6 mm proximal left ureteral stone with left hydronephrosis and perinephric stranding.  Today he reports his pain was within a week of seeing urgent care.  He had 1 episode of pain earlier this week, which resolved with p.o. Toradol.  This morning he is asymptomatic.  He has not had any nausea or vomiting since seeing urgent care.  KUB today with a stable appearing proximal left ureteral stone.  Upon review of prior CT, stone measures <900 HU and density.  Approximate skin to stone distance 13 cm.  In-office UA today positive for 2+ blood; urine microscopy with 6-10 WBCs/HPF, 3-10 RBCs/HPF, and moderate bacteria.  PMH: Past Medical History:  Diagnosis Date   Hypertension     Surgical History: No past surgical history on file.  Home Medications:  Allergies as of 06/26/2023   No Known Allergies      Medication List        Accurate as of June 26, 2023  9:30 AM. If you have any questions, ask your nurse or doctor.          cholecalciferol 25 MCG (1000 UNIT) tablet Commonly known as: VITAMIN D3 Take 5,000 Units by mouth daily.   finasteride 5 MG tablet Commonly known as: PROSCAR TAKE 1 TABLET (5 MG TOTAL) BY MOUTH DAILY.   hydrochlorothiazide 12.5 MG tablet Commonly known as: HYDRODIURIL TAKE 1 TABLET BY MOUTH EVERY DAY   ketorolac 10 MG tablet Commonly known as: TORADOL Take 1 tablet  (10 mg total) by mouth every 6 (six) hours as needed.   losartan 50 MG tablet Commonly known as: COZAAR Take 1 tablet (50 mg total) by mouth daily.   mirabegron ER 25 MG Tb24 tablet Commonly known as: MYRBETRIQ Take 1 tablet (25 mg total) by mouth daily.   ondansetron 8 MG disintegrating tablet Commonly known as: ZOFRAN-ODT Take 1 tablet (8 mg total) by mouth every 8 (eight) hours as needed for nausea or vomiting.   simvastatin 40 MG tablet Commonly known as: ZOCOR Take 1 tablet (40 mg total) by mouth at bedtime. 1/2 tablet daily   tadalafil 20 MG tablet Commonly known as: CIALIS Take 1 tablet 1 hour prior to intercourse.   tamsulosin 0.4 MG Caps capsule Commonly known as: FLOMAX Take 1 capsule (0.4 mg total) by mouth daily.        Allergies:  No Known Allergies  Family History: No family history on file.  Social History:   reports that he has never smoked. He has never used smokeless tobacco. He reports that he does not currently use alcohol. He reports that he does not currently use drugs.  Physical Exam: BP 116/70   Pulse 85   Ht 5\' 8"  (1.727 m)   Wt 200 lb (90.7 kg)   BMI 30.41 kg/m   Constitutional:  Alert and oriented, no acute distress, nontoxic appearing HEENT: [place],  AT Cardiovascular: No clubbing, cyanosis, or edema Respiratory: Normal respiratory effort, no increased work of breathing Skin: No rashes, bruises or suspicious lesions Neurologic: Grossly intact, no focal deficits, moving all 4 extremities Psychiatric: Normal mood and affect  Laboratory Data: Results for orders placed or performed in visit on 06/26/23  Microscopic Examination   Urine  Result Value Ref Range   WBC, UA 6-10 (A) 0 - 5 /hpf   RBC, Urine 3-10 (A) 0 - 2 /hpf   Epithelial Cells (non renal) 0-10 0 - 10 /hpf   Mucus, UA Present (A) Not Estab.   Bacteria, UA Moderate (A) None seen/Few  Urinalysis, Complete  Result Value Ref Range   Specific Gravity, UA 1.025 1.005 - 1.030    pH, UA 5.5 5.0 - 7.5   Color, UA Yellow Yellow   Appearance Ur Clear Clear   Leukocytes,UA Negative Negative   Protein,UA Negative Negative/Trace   Glucose, UA Negative Negative   Ketones, UA Negative Negative   RBC, UA 2+ (A) Negative   Bilirubin, UA Negative Negative   Urobilinogen, Ur 0.2 0.2 - 1.0 mg/dL   Nitrite, UA Negative Negative   Microscopic Examination See below:    Pertinent Imaging: KUB, 06/26/2023: CLINICAL DATA:  kidney stone   EXAM: ABDOMEN - 1 VIEW   COMPARISON:  CT AP, 06/12/2023 and 01/01/2022.   FINDINGS: Nonobstructed bowel-gas. Radiolucent LEFT mid ureteral calculus measures up to 0.7 cm in longitudinal dimension.   Postsurgical changes of RIGHT pelvic ORIF. No acute osseous abnormality.   IMPRESSION: LEFT mid ureteral calculus, 0.7 cm in longitudinal dimension.     Electronically Signed   By: Roanna Banning M.D.   On: 07/02/2023 08:01  I personally reviewed the images referenced above and note a stable proximal left ureteral stone.  Assessment & Plan:   1. Left ureteral stone Stone is visible on KUB today and appears stable in position.  Low suspicion for UTI, will send for culture today to confirm.  He is asymptomatic this morning.  We discussed various treatment options for his stone including trial of passage vs. ESWL vs. ureteroscopy with laser lithotripsy and stent.   We specifically discussed that ESWL is a less invasive procedure that requires less anesthesia, however patients have to pass their residual stone fragments, which may take several weeks and be associated with continued renal colic. Additionally, we discussed the limitations of ESWL including the low, 10-20% chance of treatment failure requiring repeat ESWL versus ureteroscopy in the future. By comparison, ureteroscopy is a more invasive surgical procedure that requires more anesthesia. However, URS does require placement of a ureteral stent, which will remain in place for  approximately 3-10 days and can be associated with flank pain, bladder pain, dysuria, urgency, frequency, urinary leakage, and gross hematuria.  Based on this conversation, he would like to proceed with ESWL.  Orders placed and packet given to the patient.  We discussed that he needs to complete the packet and bring it with him on the day of the procedure.  I am refilling his Flomax and sending in Percocet and Zofran for symptom control in the interim.  I advised him to refrain from Advil or Toradol after Monday given proximal stone.  We discussed return precautions including fever, uncontrolled pain, and uncontrolled nausea/vomiting. - Urinalysis, Complete - CULTURE, URINE COMPREHENSIVE - oxyCODONE-acetaminophen (PERCOCET/ROXICET) 5-325 MG tablet; Take 1 tablet by mouth every 4 (four) hours as needed for up to 5 days for severe pain.  Dispense: 10 tablet;  Refill: 0 - ondansetron (ZOFRAN-ODT) 8 MG disintegrating tablet; Take 1 tablet (8 mg total) by mouth every 8 (eight) hours as needed for nausea or vomiting.  Dispense: 20 tablet; Refill: 0 - tamsulosin (FLOMAX) 0.4 MG CAPS capsule; Take 1 capsule (0.4 mg total) by mouth daily.  Dispense: 30 capsule; Refill: 0 - Ambulatory Referral For Surgery Scheduling   Return in 6 days (on 07/02/2023) for Left ESWL with Dr. Lonna Cobb.  Carman Ching, PA-C  [facility] [place] [address], [address] [place], [place] [address] [phone]

## 2023-06-26 NOTE — Progress Notes (Unsigned)
06/26/2023 9:30 AM   Noah Ayala 11-09-1945 [id number]  CC: Chief Complaint  Patient presents with   Follow-up   HPI: Noah Ayala is a 77 y.o. 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   He was seen at [facility] on 06/04/2023 with reports of severe left flank and LLQ pain with nausea and dry heaves.  UA with no nitrates, no pyuria, >50 RBC/hpf, and rare bacteria.  CT stone study showed a 4 x 4 x 6 mm proximal left ureteral stone with left hydronephrosis and perinephric stranding.  Today he reports his pain was within a week of seeing urgent care.  He had 1 episode of pain earlier this week, which resolved with p.o. Toradol.  This morning he is asymptomatic.  He has not had any nausea or vomiting since seeing urgent care.  KUB today with a stable appearing proximal left ureteral stone.  Upon review of prior CT, stone measures <900 HU and density.  Approximate skin to stone distance 13 cm.  In-office UA today positive for 2+ blood; urine microscopy with 6-10 WBCs/HPF, 3-10 RBCs/HPF, and moderate bacteria.  PMH: Past Medical History:  Diagnosis Date   Hypertension     Surgical History: No past surgical history on file.  Home Medications:  Allergies as of 06/26/2023   No Known Allergies      Medication List        Accurate as of June 26, 2023  9:30 AM. If you have any questions, ask your nurse or doctor.          cholecalciferol 25 MCG (1000 UNIT) tablet Commonly known as: VITAMIN D3 Take 5,000 Units by mouth daily.   finasteride 5 MG tablet Commonly known as: PROSCAR TAKE 1 TABLET (5 MG TOTAL) BY MOUTH DAILY.   hydrochlorothiazide 12.5 MG tablet Commonly known as: HYDRODIURIL TAKE 1 TABLET BY MOUTH EVERY DAY   ketorolac 10 MG tablet Commonly known as: TORADOL Take 1 tablet  (10 mg total) by mouth every 6 (six) hours as needed.   losartan 50 MG tablet Commonly known as: COZAAR Take 1 tablet (50 mg total) by mouth daily.   mirabegron ER 25 MG Tb24 tablet Commonly known as: MYRBETRIQ Take 1 tablet (25 mg total) by mouth daily.   ondansetron 8 MG disintegrating tablet Commonly known as: ZOFRAN-ODT Take 1 tablet (8 mg total) by mouth every 8 (eight) hours as needed for nausea or vomiting.   simvastatin 40 MG tablet Commonly known as: ZOCOR Take 1 tablet (40 mg total) by mouth at bedtime. 1/2 tablet daily   tadalafil 20 MG tablet Commonly known as: CIALIS Take 1 tablet 1 hour prior to intercourse.   tamsulosin 0.4 MG Caps capsule Commonly known as: FLOMAX Take 1 capsule (0.4 mg total) by mouth daily.        Allergies:  No Known Allergies  Family History: No family history on file.  Social History:   reports that he has never smoked. He has never used smokeless tobacco. He reports that he does not currently use alcohol. He reports that he does not currently use drugs.  Physical Exam: BP 116/70   Pulse 85   Ht 5\' 8"  (1.727 m)   Wt 200 lb (90.7 kg)   BMI 30.41 kg/m   Constitutional:  Alert and oriented, no acute distress, nontoxic appearing HEENT: [place],  AT Cardiovascular: No clubbing, cyanosis, or edema Respiratory: Normal respiratory effort, no increased work of breathing Skin: No rashes, bruises or suspicious lesions Neurologic: Grossly intact, no focal deficits, moving all 4 extremities Psychiatric: Normal mood and affect  Laboratory Data: Results for orders placed or performed in visit on 06/26/23  Microscopic Examination   Urine  Result Value Ref Range   WBC, UA 6-10 (A) 0 - 5 /hpf   RBC, Urine 3-10 (A) 0 - 2 /hpf   Epithelial Cells (non renal) 0-10 0 - 10 /hpf   Mucus, UA Present (A) Not Estab.   Bacteria, UA Moderate (A) None seen/Few  Urinalysis, Complete  Result Value Ref Range   Specific Gravity, UA 1.025 1.005 - 1.030    pH, UA 5.5 5.0 - 7.5   Color, UA Yellow Yellow   Appearance Ur Clear Clear   Leukocytes,UA Negative Negative   Protein,UA Negative Negative/Trace   Glucose, UA Negative Negative   Ketones, UA Negative Negative   RBC, UA 2+ (A) Negative   Bilirubin, UA Negative Negative   Urobilinogen, Ur 0.2 0.2 - 1.0 mg/dL   Nitrite, UA Negative Negative   Microscopic Examination See below:    Pertinent Imaging: KUB, 06/26/2023: CLINICAL DATA:  kidney stone   EXAM: ABDOMEN - 1 VIEW   COMPARISON:  CT AP, 06/12/2023 and 01/01/2022.   FINDINGS: Nonobstructed bowel-gas. Radiolucent LEFT mid ureteral calculus measures up to 0.7 cm in longitudinal dimension.   Postsurgical changes of RIGHT pelvic ORIF. No acute osseous abnormality.   IMPRESSION: LEFT mid ureteral calculus, 0.7 cm in longitudinal dimension.     Electronically Signed   By: Roanna Banning M.D.   On: 07/02/2023 08:01  I personally reviewed the images referenced above and note a stable proximal left ureteral stone.  Assessment & Plan:   1. Left ureteral stone Stone is visible on KUB today and appears stable in position.  Low suspicion for UTI, will send for culture today to confirm.  He is asymptomatic this morning.  We discussed various treatment options for his stone including trial of passage vs. ESWL vs. ureteroscopy with laser lithotripsy and stent.   We specifically discussed that ESWL is a less invasive procedure that requires less anesthesia, however patients have to pass their residual stone fragments, which may take several weeks and be associated with continued renal colic. Additionally, we discussed the limitations of ESWL including the low, 10-20% chance of treatment failure requiring repeat ESWL versus ureteroscopy in the future. By comparison, ureteroscopy is a more invasive surgical procedure that requires more anesthesia. However, URS does require placement of a ureteral stent, which will remain in place for  approximately 3-10 days and can be associated with flank pain, bladder pain, dysuria, urgency, frequency, urinary leakage, and gross hematuria.  Based on this conversation, he would like to proceed with ESWL.  Orders placed and packet given to the patient.  We discussed that he needs to complete the packet and bring it with him on the day of the procedure.  I am refilling his Flomax and sending in Percocet and Zofran for symptom control in the interim.  I advised him to refrain from Advil or Toradol after Monday given proximal stone.  We discussed return precautions including fever, uncontrolled pain, and uncontrolled nausea/vomiting. - Urinalysis, Complete - CULTURE, URINE COMPREHENSIVE - oxyCODONE-acetaminophen (PERCOCET/ROXICET) 5-325 MG tablet; Take 1 tablet by mouth every 4 (four) hours as needed for up to 5 days for severe pain.  Dispense: 10 tablet;  Refill: 0 - ondansetron (ZOFRAN-ODT) 8 MG disintegrating tablet; Take 1 tablet (8 mg total) by mouth every 8 (eight) hours as needed for nausea or vomiting.  Dispense: 20 tablet; Refill: 0 - tamsulosin (FLOMAX) 0.4 MG CAPS capsule; Take 1 capsule (0.4 mg total) by mouth daily.  Dispense: 30 capsule; Refill: 0 - Ambulatory Referral For Surgery Scheduling   Return in 6 days (on 07/02/2023) for Left ESWL with Dr. Lonna Cobb.  Carman Ching, PA-C  [facility] [place] [address], [address] [place], [place] [address] [phone]

## 2023-06-26 NOTE — Progress Notes (Signed)
ESWL ORDER FORM  Expected date of procedure: 07/02/2023  Surgeon: Irineo Axon, MD  Post op standing: 2-4wk follow up w/KUB prior  Anticoagulation/Aspirin/NSAID standing order: Hold all 72 hours prior  Anesthesia standing order: MAC  VTE standing: SCD's  Dx: Left Ureteral Stone  Procedure: left Extracorporeal shock wave lithotripsy  CPT : 50590  Standing Order Set:   *NPO after mn, KUB  *NS 172ml/hr, Keflex 500mg  PO, Benadryl 25mg  PO, Valium 10mg  PO, Zofran 4mg  IV  Medications if other than standing orders:   NONE

## 2023-06-29 LAB — CULTURE, URINE COMPREHENSIVE

## 2023-07-01 MED ORDER — DIAZEPAM 5 MG PO TABS
10.0000 mg | ORAL_TABLET | ORAL | Status: AC
  Administered 2023-07-02: 10 mg via ORAL

## 2023-07-01 MED ORDER — ONDANSETRON HCL 4 MG/2ML IJ SOLN
4.0000 mg | Freq: Once | INTRAMUSCULAR | Status: AC
  Administered 2023-07-02: 4 mg via INTRAVENOUS

## 2023-07-01 MED ORDER — SODIUM CHLORIDE 0.9 % IV SOLN: INTRAVENOUS | Status: DC

## 2023-07-01 MED ORDER — CEPHALEXIN 500 MG PO CAPS
500.0000 mg | ORAL_CAPSULE | Freq: Once | ORAL | Status: AC
  Administered 2023-07-02: 500 mg via ORAL

## 2023-07-01 MED ORDER — DIPHENHYDRAMINE HCL 25 MG PO CAPS
25.0000 mg | ORAL_CAPSULE | ORAL | Status: AC
  Administered 2023-07-02: 25 mg via ORAL

## 2023-07-02 ENCOUNTER — Ambulatory Visit: Payer: Medicare Other

## 2023-07-02 ENCOUNTER — Other Ambulatory Visit: Payer: Self-pay

## 2023-07-02 ENCOUNTER — Encounter
Admission: RE | Disposition: A | Discharge status: Home / Self Care | Payer: Self-pay | Source: Home / Self Care | Attending: Urology

## 2023-07-02 ENCOUNTER — Ambulatory Visit
Admission: RE | Admit: 2023-07-02 | Discharge: 2023-07-02 | Disposition: A | Discharge status: Home / Self Care | Payer: Medicare Other | Attending: Urology | Admitting: Urology

## 2023-07-02 ENCOUNTER — Encounter: Payer: Self-pay | Admitting: Urology

## 2023-07-02 DIAGNOSIS — N400 Enlarged prostate without lower urinary tract symptoms: Secondary | ICD-10-CM | POA: Insufficient documentation

## 2023-07-02 DIAGNOSIS — N201 Calculus of ureter: Secondary | ICD-10-CM | POA: Insufficient documentation

## 2023-07-02 DIAGNOSIS — I10 Essential (primary) hypertension: Secondary | ICD-10-CM | POA: Insufficient documentation

## 2023-07-02 HISTORY — PX: EXTRACORPOREAL SHOCK WAVE LITHOTRIPSY: SHX1557

## 2023-07-02 SURGERY — LITHOTRIPSY, ESWL
Anesthesia: Moderate Sedation | Laterality: Left

## 2023-07-02 MED ORDER — ONDANSETRON HCL 4 MG/2ML IJ SOLN
INTRAMUSCULAR | Status: AC
  Filled 2023-07-02: qty 2

## 2023-07-02 MED ORDER — HYDROCODONE-ACETAMINOPHEN 5-325 MG PO TABS
1.0000 | ORAL_TABLET | Freq: Four times a day (QID) | ORAL | 0 refills | Status: DC | PRN
Start: 2023-07-02 — End: 2024-03-03

## 2023-07-02 MED ORDER — CEPHALEXIN 500 MG PO CAPS
ORAL_CAPSULE | ORAL | Status: AC
  Filled 2023-07-02: qty 1

## 2023-07-02 MED ORDER — DIAZEPAM 5 MG PO TABS
ORAL_TABLET | ORAL | Status: AC
  Filled 2023-07-02: qty 2

## 2023-07-02 MED ORDER — DIPHENHYDRAMINE HCL 25 MG PO CAPS
ORAL_CAPSULE | ORAL | Status: AC
  Filled 2023-07-02: qty 1

## 2023-07-02 NOTE — Discharge Instructions (Addendum)
AMBULATORY SURGERY  DISCHARGE INSTRUCTIONS   The drugs that you were given will stay in your system until tomorrow so for the next 24 hours you should not:  Drive an automobile Make any legal decisions Drink any alcoholic beverage   You may resume regular meals tomorrow.  Today it is better to start with liquids and gradually work up to solid foods.  You may eat anything you prefer, but it is better to start with liquids, then soup and crackers, and gradually work up to solid foods.   Please notify your doctor immediately if you have any unusual bleeding, trouble breathing, redness and pain at the surgery site, drainage, fever, or pain not relieved by medication.    Additional Instructions:  As per the [facility] discharge instructions A prescription for pain medication was sent to your pharmacy Continue tamsulosin which will help you pass stone fragments  Call [facility] at [phone] for pain not controlled with oral medications or fever greater than 101 degrees You will be contacted for a follow-up appointment

## 2023-07-02 NOTE — Interval H&P Note (Signed)
History and Physical Interval Note:  07/02/2023 9:27 AM  Noah Ayala  has presented today for surgery, with the diagnosis of Left Ureteral Stone.  The various methods of treatment have been discussed with the patient and family. After consideration of risks, benefits and other options for treatment, the patient has consented to  Procedure(s): EXTRACORPOREAL SHOCK WAVE LITHOTRIPSY (ESWL) (Left) as a surgical intervention.  The patient's history has been reviewed, patient examined, no change in status, stable for surgery.  I have reviewed the patient's chart and labs.  Questions were answered to the patient's satisfaction.    CV:RRR Lungs:clear  Riki Altes

## 2023-07-03 ENCOUNTER — Other Ambulatory Visit: Payer: Self-pay

## 2023-07-03 DIAGNOSIS — N201 Calculus of ureter: Secondary | ICD-10-CM

## 2023-07-14 ENCOUNTER — Other Ambulatory Visit: Payer: Self-pay | Admitting: Family Medicine

## 2023-07-14 DIAGNOSIS — I10 Essential (primary) hypertension: Secondary | ICD-10-CM

## 2023-07-30 ENCOUNTER — Ambulatory Visit
Admission: RE | Admit: 2023-07-30 | Discharge: 2023-07-30 | Disposition: A | Discharge status: Home / Self Care | Payer: Medicare Other | Attending: Urology | Admitting: Urology

## 2023-07-30 ENCOUNTER — Ambulatory Visit: Payer: Medicare Other | Admitting: Urology

## 2023-07-30 ENCOUNTER — Encounter: Payer: Self-pay | Admitting: Urology

## 2023-07-30 ENCOUNTER — Ambulatory Visit
Admission: RE | Admit: 2023-07-30 | Discharge: 2023-07-30 | Disposition: A | Discharge status: Home / Self Care | Payer: Medicare Other | Source: Ambulatory Visit | Attending: Urology | Admitting: Urology

## 2023-07-30 VITALS — BP 130/80 | HR 65 | Ht 68.0 in | Wt 200.0 lb

## 2023-07-30 DIAGNOSIS — Z87898 Personal history of other specified conditions: Secondary | ICD-10-CM | POA: Diagnosis not present

## 2023-07-30 DIAGNOSIS — N201 Calculus of ureter: Secondary | ICD-10-CM | POA: Insufficient documentation

## 2023-07-30 DIAGNOSIS — Z87442 Personal history of urinary calculi: Secondary | ICD-10-CM | POA: Diagnosis not present

## 2023-07-30 DIAGNOSIS — R972 Elevated prostate specific antigen [PSA]: Secondary | ICD-10-CM | POA: Diagnosis not present

## 2023-07-30 DIAGNOSIS — N401 Enlarged prostate with lower urinary tract symptoms: Secondary | ICD-10-CM

## 2023-07-30 NOTE — Progress Notes (Signed)
I,Jeremy Z Plume,acting as a Neurosurgeon for Noah Altes, MD.,have documented all relevant documentation on the behalf of Noah Altes, MD,as directed by  Noah Altes, MD while in the presence of Noah Altes, MD.  07/30/2023 10:18 AM   Noah Ayala [birth year] [id number]  Referring provider: Duanne Limerick, MD [address] [place],  [place] [address]  Chief Complaint  Patient presents with   Nephrolithiasis   Urologic history: 1. History of gross hematuria Gross hematuria evaluations while living in [place] in 2014, 2017, and 2018, which showed no significant abnormalities. Bladder biopsy in 2017 was negative Reevaluation 01/2022 with CTU showing 5 mm non-obstructing left renal calculus and cystoscopy remarkable for prominent lateral lobe enlargement with hypervascularity  2. Elevated PSA Benign prostate biopsy in 2015, PSA 5.9  3. BPH with LUTS Presently on finasteride 5 mg daily CTU 2023 showed a prostate volume of 124 cc's  4. Nephrolithiasis CTU 11/2021 with 5 mm non-obstructing left renal calculus SWL 07/02/2023 after migration of his left ureteral calculus to the proximal ureter   HPI: Noah Ayala is a 77 y.o. male who presents for annual/ post-op follow up.   No post-op problems after shockwave lithotripsy for his left proximal ureteral calculus on 07/02/2023 KUB performed today was personally reviewed and interpreted, and the previously identified calculus is no longer present. He did pass several small fragments Repeat PSA on finasteride had an appropriate decrease and was 3.1 in 01/2023 Denies recurrent gross hematuria   PMH: Past Medical History:  Diagnosis Date   Hypertension     Surgical History: Past Surgical History:  Procedure Laterality Date   EXTRACORPOREAL SHOCK WAVE LITHOTRIPSY Left 07/02/2023   Procedure: EXTRACORPOREAL SHOCK WAVE LITHOTRIPSY (ESWL);  Surgeon: Noah Altes, MD;  Location: [facility] ORS;  Service: Urology;   Laterality: Left;    Home Medications:  Allergies as of 07/30/2023   No Known Allergies      Medication List        Accurate as of July 30, 2023 10:18 AM. If you have any questions, ask your nurse or doctor.          cholecalciferol 25 MCG (1000 UNIT) tablet Commonly known as: VITAMIN D3 Take 5,000 Units by mouth daily.   finasteride 5 MG tablet Commonly known as: PROSCAR TAKE 1 TABLET (5 MG TOTAL) BY MOUTH DAILY.   hydrochlorothiazide 12.5 MG tablet Commonly known as: HYDRODIURIL TAKE 1 TABLET BY MOUTH EVERY DAY   HYDROcodone-acetaminophen 5-325 MG tablet Commonly known as: NORCO/VICODIN Take 1 tablet by mouth every 6 (six) hours as needed for moderate pain.   ketorolac 10 MG tablet Commonly known as: TORADOL Take 1 tablet (10 mg total) by mouth every 6 (six) hours as needed.   losartan 50 MG tablet Commonly known as: COZAAR Take 1 tablet (50 mg total) by mouth daily.   mirabegron ER 25 MG Tb24 tablet Commonly known as: MYRBETRIQ Take 1 tablet (25 mg total) by mouth daily.   ondansetron 8 MG disintegrating tablet Commonly known as: ZOFRAN-ODT Take 1 tablet (8 mg total) by mouth every 8 (eight) hours as needed for nausea or vomiting.   simvastatin 40 MG tablet Commonly known as: ZOCOR Take 1 tablet (40 mg total) by mouth at bedtime. 1/2 tablet daily   tadalafil 20 MG tablet Commonly known as: CIALIS Take 1 tablet 1 hour prior to intercourse.   tamsulosin 0.4 MG Caps capsule Commonly known as: FLOMAX Take 1 capsule (0.4 mg total)  by mouth daily.        Social History:  reports that he has never smoked. He has never used smokeless tobacco. He reports that he does not currently use alcohol. He reports that he does not currently use drugs.   Physical Exam: BP 130/80   Pulse 65   Ht 5\' 8"  (1.727 m)   Wt 200 lb (90.7 kg)   BMI 30.41 kg/m   Constitutional:  Alert and oriented, No acute distress. HEENT: [place] AT, moist mucus membranes.  Trachea  midline, no masses. Neurologic: Grossly intact, no focal deficits, moving all 4 extremities. Psychiatric: Normal mood and affect.  Pertinent Imaging: KUB performed today was personally reviewed and interpreted, and the previously identified calculus is no longer present. He did pass several small fragments   Assessment & Plan:   1.   Status post shockwave lithotripsy for 5 mm ureteral calculus KUB today was negative  2. BPH with LUTS Stable on finasteride  3. Elevated PSA Stable PSA with appropriate decrease on finasteride  4. History of gross hematuria No recurrent episodes  One year follow up with KUB   [facility] [address], [address] [place], [place] [address] [phone]

## 2023-08-18 ENCOUNTER — Other Ambulatory Visit: Payer: Self-pay | Admitting: Family Medicine

## 2023-08-18 DIAGNOSIS — I10 Essential (primary) hypertension: Secondary | ICD-10-CM

## 2023-09-18 ENCOUNTER — Other Ambulatory Visit: Payer: Self-pay | Admitting: Family Medicine

## 2023-09-18 DIAGNOSIS — I10 Essential (primary) hypertension: Secondary | ICD-10-CM

## 2023-12-09 ENCOUNTER — Other Ambulatory Visit: Payer: Self-pay | Admitting: Family Medicine

## 2023-12-09 DIAGNOSIS — E785 Hyperlipidemia, unspecified: Secondary | ICD-10-CM

## 2024-02-21 ENCOUNTER — Other Ambulatory Visit: Payer: Self-pay | Admitting: Family Medicine

## 2024-02-21 DIAGNOSIS — I10 Essential (primary) hypertension: Secondary | ICD-10-CM

## 2024-03-02 ENCOUNTER — Ambulatory Visit: Admitting: Family Medicine

## 2024-03-03 ENCOUNTER — Encounter: Payer: Self-pay | Admitting: Family Medicine

## 2024-03-03 ENCOUNTER — Ambulatory Visit (INDEPENDENT_AMBULATORY_CARE_PROVIDER_SITE_OTHER): Admitting: Family Medicine

## 2024-03-03 ENCOUNTER — Other Ambulatory Visit: Payer: Self-pay | Admitting: Family Medicine

## 2024-03-03 ENCOUNTER — Ambulatory Visit: Admitting: Family Medicine

## 2024-03-03 VITALS — BP 112/70 | HR 87 | Ht 68.0 in | Wt 208.0 lb

## 2024-03-03 DIAGNOSIS — R972 Elevated prostate specific antigen [PSA]: Secondary | ICD-10-CM

## 2024-03-03 DIAGNOSIS — E785 Hyperlipidemia, unspecified: Secondary | ICD-10-CM | POA: Diagnosis not present

## 2024-03-03 DIAGNOSIS — R5383 Other fatigue: Secondary | ICD-10-CM | POA: Diagnosis not present

## 2024-03-03 DIAGNOSIS — N401 Enlarged prostate with lower urinary tract symptoms: Secondary | ICD-10-CM

## 2024-03-03 DIAGNOSIS — E782 Mixed hyperlipidemia: Secondary | ICD-10-CM

## 2024-03-03 DIAGNOSIS — I10 Essential (primary) hypertension: Secondary | ICD-10-CM | POA: Diagnosis not present

## 2024-03-03 MED ORDER — LOSARTAN POTASSIUM 50 MG PO TABS: 50.0000 mg | ORAL_TABLET | Freq: Every day | ORAL | 1 refills | Status: DC

## 2024-03-03 MED ORDER — SIMVASTATIN 40 MG PO TABS
40.0000 mg | ORAL_TABLET | Freq: Every day | ORAL | 1 refills | Status: AC
Start: 2024-03-03 — End: ?

## 2024-03-03 NOTE — Progress Notes (Signed)
 Established Patient Office Visit  Subjective   Patient ID: Noah Ayala, male    DOB: 11/23/1945  Age: 78 y.o. MRN: [id number]  Chief Complaint  Patient presents with   New Patient (Initial Visit)   Hyperlipidemia     Assessment & Plan:   Problem List Items Addressed This Visit       Cardiovascular and Mediastinum   Benign essential HTN   Blood pressure at goal, continue current medications.  Refills pended.      Relevant Medications   simvastatin  (ZOCOR ) 40 MG tablet   losartan  (COZAAR ) 50 MG tablet     Other   Hyperlipidemia, mixed   Lab Results  Component Value Date   CHOL 189 10/13/2022   HDL 45 10/13/2022   LDLCALC 109 (H) 10/13/2022   TRIG 199 (H) 10/13/2022   On Crestor, recheck lipid panel , refills sent.      Relevant Medications   simvastatin  (ZOCOR ) 40 MG tablet   losartan  (COZAAR ) 50 MG tablet   Other Visit Diagnoses       Primary hypertension    -  Primary   Relevant Medications   simvastatin  (ZOCOR ) 40 MG tablet   losartan  (COZAAR ) 50 MG tablet   Other Relevant Orders   Comprehensive Metabolic Panel (CMET)     Hyperlipidemia, unspecified hyperlipidemia type       Relevant Medications   simvastatin  (ZOCOR ) 40 MG tablet   losartan  (COZAAR ) 50 MG tablet   Other Relevant Orders   Lipid panel     Elevated PSA       Relevant Orders   PSA     Other fatigue       Relevant Orders   CBC with Differential/Platelet   TSH     Benign localized prostatic hyperplasia with lower urinary tract symptoms (LUTS)       Relevant Orders   Urinalysis       Return in about 6 months (around 09/02/2024) for chronic follow up with PCP.   78 year old male with past medical history of hypertension, hyperlipidemia, LUTS, elevated PSA presents to clinic for chronic conditions follow-up.  Patient is requesting refill for his cholesterol medication.  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 Helps with translation. He is planning to  retire completley go on a trip. Lives with wife, has 3 grown up kids. ( D, S, D).  No significant health or social history changes since last visit with Dr.Jones.   He leads sedentary life style.  He is aware of weight gain and will start exercising.  Hypertension:  Controlled with medications. Denies side effects.    Hyperlipidemia:  On statin.  Denies side effects patient request refill.  Patient is also requesting routine lab work.       Review of Systems  All other systems reviewed and are negative.     Objective:     BP 112/70   Pulse 87   Ht 5' 8 (1.727 m)   Wt 208 lb (94.3 kg)   SpO2 96%   BMI 31.63 kg/m    Physical Exam Vitals and nursing note reviewed.  Constitutional:      Appearance: Normal appearance.  HENT:     Head: Normocephalic.     Right Ear: External ear normal.     Left Ear: External ear normal.   Eyes:     Conjunctiva/sclera: Conjunctivae normal.    Cardiovascular:     Rate and Rhythm: Normal rate.  Pulmonary:     Effort: Pulmonary effort is normal. No respiratory distress.  Abdominal:     Palpations: Abdomen is soft.   Musculoskeletal:        General: Normal range of motion.   Skin:    General: Skin is warm.   Neurological:     Mental Status: He is alert and oriented to person, place, and time.   Psychiatric:        Mood and Affect: Mood normal.      No results found for any visits on 03/03/24.    The 10-year ASCVD risk score (Arnett DK, et al., 2019) is: 27.9%      Vinary K Lamin Chandley, MD

## 2024-03-03 NOTE — Assessment & Plan Note (Signed)
 Lab Results  Component Value Date   CHOL 189 10/13/2022   HDL 45 10/13/2022   LDLCALC 109 (H) 10/13/2022   TRIG 199 (H) 10/13/2022   On Crestor, recheck lipid panel , refills sent.

## 2024-03-03 NOTE — Assessment & Plan Note (Signed)
 Blood pressure at goal, continue current medications.  Refills pended.

## 2024-03-04 ENCOUNTER — Ambulatory Visit: Payer: Self-pay | Admitting: Family Medicine

## 2024-03-04 LAB — LIPID PANEL
Chol/HDL Ratio: 4.7 ratio (ref 0.0–5.0)
Cholesterol, Total: 187 mg/dL (ref 100–199)
HDL: 40 mg/dL (ref >39)
LDL Chol Calc (NIH): 83 mg/dL (ref 0–99)
Triglycerides: 396 mg/dL — ABNORMAL HIGH (ref 0–149)
VLDL Cholesterol Cal: 64 mg/dL — ABNORMAL HIGH (ref 5–40)

## 2024-03-04 LAB — COMPREHENSIVE METABOLIC PANEL WITH GFR
ALT: 11 IU/L (ref 0–44)
AST: 15 IU/L (ref 0–40)
Albumin: 4.4 g/dL (ref 3.8–4.8)
Alkaline Phosphatase: 63 IU/L (ref 44–121)
BUN/Creatinine Ratio: 18 (ref 10–24)
BUN: 17 mg/dL (ref 8–27)
Bilirubin Total: 0.3 mg/dL (ref 0.0–1.2)
CO2: 22 mmol/L (ref 20–29)
Calcium: 9.5 mg/dL (ref 8.6–10.2)
Chloride: 102 mmol/L (ref 96–106)
Creatinine, Ser: 0.94 mg/dL (ref 0.76–1.27)
Globulin, Total: 2.3 g/dL (ref 1.5–4.5)
Glucose: 155 mg/dL — ABNORMAL HIGH (ref 70–99)
Potassium: 5 mmol/L (ref 3.5–5.2)
Sodium: 141 mmol/L (ref 134–144)
Total Protein: 6.7 g/dL (ref 6.0–8.5)
eGFR: 83 mL/min/{1.73_m2} (ref >59)

## 2024-03-04 LAB — CBC WITH DIFFERENTIAL/PLATELET
Basophils Absolute: 0.1 10*3/uL (ref 0.0–0.2)
Basos: 1 %
EOS (ABSOLUTE): 0.3 10*3/uL (ref 0.0–0.4)
Eos: 3 %
Hematocrit: 44.8 % (ref 37.5–51.0)
Hemoglobin: 14.2 g/dL (ref 13.0–17.7)
Immature Grans (Abs): 0 10*3/uL (ref 0.0–0.1)
Immature Granulocytes: 0 %
Lymphocytes Absolute: 2.4 10*3/uL (ref 0.7–3.1)
Lymphs: 25 %
MCH: 28.7 pg (ref 26.6–33.0)
MCHC: 31.7 g/dL (ref 31.5–35.7)
MCV: 91 fL (ref 79–97)
Monocytes Absolute: 0.7 10*3/uL (ref 0.1–0.9)
Monocytes: 7 %
Neutrophils Absolute: 6.2 10*3/uL (ref 1.4–7.0)
Neutrophils: 64 %
Platelets: 314 10*3/uL (ref 150–450)
RBC: 4.94 x10E6/uL (ref 4.14–5.80)
RDW: 13.9 % (ref 11.6–15.4)
WBC: 9.7 10*3/uL (ref 3.4–10.8)

## 2024-03-04 LAB — URINALYSIS
Bilirubin, UA: NEGATIVE
Glucose, UA: NEGATIVE
Leukocytes,UA: NEGATIVE
Nitrite, UA: NEGATIVE
Protein,UA: NEGATIVE
RBC, UA: NEGATIVE
Specific Gravity, UA: >=1.03 — AB (ref 1.005–1.030)
Urobilinogen, Ur: 0.2 mg/dL (ref 0.2–1.0)
pH, UA: 5.5 (ref 5.0–7.5)

## 2024-03-04 LAB — TSH: TSH: 2.61 u[IU]/mL (ref 0.450–4.500)

## 2024-03-04 LAB — PSA: Prostate Specific Ag, Serum: 4.5 ng/mL — ABNORMAL HIGH (ref 0.0–4.0)

## 2024-03-07 ENCOUNTER — Other Ambulatory Visit: Payer: Self-pay | Admitting: Family Medicine

## 2024-03-07 DIAGNOSIS — E785 Hyperlipidemia, unspecified: Secondary | ICD-10-CM

## 2024-03-07 MED ORDER — SIMVASTATIN 40 MG PO TABS: 20.0000 mg | ORAL_TABLET | Freq: Every day | ORAL | 1 refills | Status: DC

## 2024-03-07 NOTE — Telephone Encounter (Signed)
 He take half pill per day that's 20 mg every day.

## 2024-03-07 NOTE — Telephone Encounter (Signed)
 Please resend RX to pharmacy with the SIG correction.

## 2024-03-07 NOTE — Telephone Encounter (Signed)
 Please review message about prescription

## 2024-04-18 ENCOUNTER — Encounter: Payer: Self-pay | Admitting: Urology

## 2024-06-01 ENCOUNTER — Other Ambulatory Visit: Payer: Self-pay

## 2024-06-01 ENCOUNTER — Ambulatory Visit: Payer: Medicare Other | Admitting: Emergency Medicine

## 2024-06-01 ENCOUNTER — Telehealth: Payer: Self-pay

## 2024-06-01 VITALS — Ht 68.0 in | Wt 200.0 lb

## 2024-06-01 DIAGNOSIS — Z0000 Encounter for general adult medical examination without abnormal findings: Secondary | ICD-10-CM | POA: Diagnosis not present

## 2024-06-01 DIAGNOSIS — Z23 Encounter for immunization: Secondary | ICD-10-CM

## 2024-06-01 MED ORDER — COVID-19 MRNA VAC-TRIS(PFIZER) 30 MCG/0.3ML IM SUSY
0.3000 mL | PREFILLED_SYRINGE | Freq: Once | INTRAMUSCULAR | 0 refills | Status: AC
Start: 2024-06-01 — End: 2024-06-01

## 2024-06-01 MED ORDER — COVID-19 MRNA VAC-TRIS(PFIZER) 30 MCG/0.3ML IM SUSY
0.3000 mL | PREFILLED_SYRINGE | Freq: Once | INTRAMUSCULAR | 0 refills | Status: DC
Start: 2024-06-01 — End: 2024-06-01

## 2024-06-01 NOTE — Progress Notes (Signed)
 Subjective:   Noah Ayala is a 78 y.o. who presents for a Medicare Wellness preventive visit.  As a reminder, Annual Wellness Visits don't include a physical exam, and some assessments may be limited, especially if this visit is performed virtually. We may recommend an in-person follow-up visit with your provider if needed.  Visit Complete: Virtual I connected with  Noah Ayala on 06/01/24 by a audio enabled telemedicine application and verified that I am speaking with the correct person using two identifiers.  Patient Location: Home  Provider Location: Home Office  I discussed the limitations of evaluation and management by telemedicine. The patient expressed understanding and agreed to proceed.  Vital Signs: Because this visit was a virtual/telehealth visit, some criteria may be missing or patient reported. Any vitals not documented were not able to be obtained and vitals that have been documented are patient reported.  VideoDeclined- This patient declined Librarian, academic. Therefore the visit was completed with audio only.  Persons Participating in Visit: Patient.  AWV Questionnaire: No: Patient Medicare AWV questionnaire was not completed prior to this visit.  Cardiac Risk Factors include: advanced age (>71men, >21 women);male gender;dyslipidemia;hypertension;obesity (BMI >30kg/m2)     Objective:    Today's Vitals   06/01/24 0848  Weight: 200 lb (90.7 kg)  Height: 5' 8 (1.727 m)   Body mass index is 30.41 kg/m.     06/01/2024    9:00 AM 07/02/2023    7:44 AM 06/04/2023    8:37 AM 05/27/2023    9:40 AM 06/10/2021    7:14 PM  Advanced Directives  Does Patient Have a Medical Advance Directive? Yes No No No No  Type of Estate agent of [place];Living will      Does patient want to make changes to medical advance directive? No - Patient declined      Copy of Healthcare Power of Attorney in Chart? No - copy requested       Would patient like information on creating a medical advance directive?  No - Patient declined  No - Patient declined     Current Medications (verified) Outpatient Encounter Medications as of 06/01/2024  Medication Sig   cholecalciferol (VITAMIN D3) 25 MCG (1000 UNIT) tablet Take 5,000 Units by mouth daily.   COVID-19 mRNA vaccine, Pfizer, (COMIRNATY) syringe Inject 0.3 mLs into the muscle once for 1 dose.   losartan  (COZAAR ) 50 MG tablet Take 1 tablet (50 mg total) by mouth daily.   simvastatin  (ZOCOR ) 40 MG tablet Take 0.5 tablets (20 mg total) by mouth at bedtime. 1/2 tablet daily   [DISCONTINUED] COVID-19 mRNA vaccine, Pfizer, (COMIRNATY) syringe Inject 0.3 mLs into the muscle once for 1 dose.   finasteride  (PROSCAR ) 5 MG tablet TAKE 1 TABLET (5 MG TOTAL) BY MOUTH DAILY. (Patient not taking: Reported on 06/01/2024)   mirabegron  ER (MYRBETRIQ ) 25 MG TB24 tablet Take 1 tablet (25 mg total) by mouth daily. (Patient not taking: Reported on 06/01/2024)   tadalafil  (CIALIS ) 20 MG tablet Take 1 tablet 1 hour prior to intercourse. (Patient not taking: Reported on 06/01/2024)   No facility-administered encounter medications on file as of 06/01/2024.    Allergies (verified) Patient has no known allergies.   History: Past Medical History:  Diagnosis Date   Hypertension    Past Surgical History:  Procedure Laterality Date   EXTRACORPOREAL SHOCK WAVE LITHOTRIPSY Left 07/02/2023   Procedure: EXTRACORPOREAL SHOCK WAVE LITHOTRIPSY (ESWL);  Surgeon: Twylla Glendia BROCKS, MD;  Location: [facility] ORS;  Service: Urology;  Laterality: Left;   Family History  Problem Relation Age of Onset   Heart disease Mother    Arthritis Father    Heart disease Father    Social History   Socioeconomic History   Marital status: Married    Spouse name: Sames,DIANE   Number of children: 3   Years of education: Not on file   Highest education level: Not on file  Occupational History   Occupation: Retired  Tobacco  Use   Smoking status: Never   Smokeless tobacco: Never  Vaping Use   Vaping status: Never Used  Substance and Sexual Activity   Alcohol use: Not Currently   Drug use: Not Currently   Sexual activity: Yes  Other Topics Concern   Not on file  Social History Narrative   Not on file   Social Drivers of Health   Financial Resource Strain: Low Risk  (06/01/2024)   Overall Financial Resource Strain (CARDIA)    Difficulty of Paying Living Expenses: Not hard at all  Food Insecurity: No Food Insecurity (06/01/2024)   Hunger Vital Sign    Worried About Running Out of Food in the Last Year: Never true    Ran Out of Food in the Last Year: Never true  Transportation Needs: No Transportation Needs (06/01/2024)   PRAPARE - Administrator, Civil Service (Medical): No    Lack of Transportation (Non-Medical): No  Physical Activity: Inactive (06/01/2024)   Exercise Vital Sign    Days of Exercise per Week: 0 days    Minutes of Exercise per Session: 0 min  Stress: No Stress Concern Present (06/01/2024)   [employer] of Occupational Health - Occupational Stress Questionnaire    Feeling of Stress: Not at all  Social Connections: Moderately Integrated (06/01/2024)   Social Connection and Isolation Panel    Frequency of Communication with Friends and Family: More than three times a week    Frequency of Social Gatherings with Friends and Family: More than three times a week    Attends Religious Services: More than 4 times per year    Active Member of [employer] or Organizations: No    Attends Engineer, structural: Never    Marital Status: Married    Tobacco Counseling Counseling given: Not Answered    Clinical Intake:  Pre-visit preparation completed: Yes  Pain : No/denies pain     BMI - recorded: 30.41 Nutritional Status: BMI > 30  Obese Nutritional Risks: None Diabetes: No  No results found for: HGBA1C   How often do you need to have someone help you when you  read instructions, pamphlets, or other written materials from your doctor or pharmacy?: 1 - Never  Interpreter Needed?: No  Information entered by :: Vina Ned, CMA   Activities of Daily Living     06/01/2024    8:50 AM 07/02/2023    7:41 AM  In your present state of health, do you have any difficulty performing the following activities:  Hearing? 0 0  Vision? 0 0  Difficulty concentrating or making decisions? 0 0  Walking or climbing stairs? 0   Dressing or bathing? 0   Doing errands, shopping? 0   Preparing Food and eating ? N   Using the Toilet? N   In the past six months, have you accidently leaked urine? Y   Comment wears a pad   Do you have problems with loss of bowel control? N   Managing your Medications? N  Managing your Finances? N   Housekeeping or managing your Housekeeping? N     Patient Care Team: Kotturi, Vinay K, MD as PCP - General (Family Medicine) [facility] (Optometry) Twylla, Glendia BROCKS, MD (Urology)  I have updated your Care Teams any recent Medical Services you may have received from other providers in the past year.     Assessment:   This is a routine wellness examination for Noah.  Hearing/Vision screen Hearing Screening - Comments:: Denies hearing loss  Vision Screening - Comments:: Gets routine eye exams, [facility], [place]    Goals Addressed               This Visit's Progress     Increase physical activity (pt-stated)         Depression Screen     06/01/2024    8:58 AM 05/27/2023    9:37 AM 10/13/2022   10:02 AM 09/01/2022    3:37 PM 05/19/2022    9:28 AM 05/16/2022   11:15 AM 04/08/2022    1:39 PM  PHQ 2/9 Scores  PHQ - 2 Score 0 0 0 0 0 0 0  PHQ- 9 Score 0 0 0 0 1 0 2    Fall Risk     06/01/2024    9:04 AM 05/27/2023    9:41 AM 10/13/2022   10:01 AM 09/01/2022    3:37 PM 05/19/2022    9:27 AM  Fall Risk   Falls in the past year? 0 0 0 0 0  Number falls in past yr: 0 0 0 0 0  Injury with Fall? 0 0 0 0 0   Risk for fall due to : No Fall Risks No Fall Risks No Fall Risks No Fall Risks No Fall Risks  Follow up Falls evaluation completed Falls prevention discussed;Falls evaluation completed Falls evaluation completed  Falls evaluation completed  Falls evaluation completed      Data saved with a previous flowsheet row definition    MEDICARE RISK AT HOME:  Medicare Risk at Home Any stairs in or around the home?: Yes If so, are there any without handrails?: No Home free of loose throw rugs in walkways, pet beds, electrical cords, etc?: Yes Adequate lighting in your home to reduce risk of falls?: Yes Life alert?: No Use of a cane, walker or w/c?: No Grab bars in the bathroom?: No Shower chair or bench in shower?: No Elevated toilet seat or a handicapped toilet?: No  TIMED UP AND GO:  Was the test performed?  No  Cognitive Function: 6CIT completed        06/01/2024    9:05 AM 05/27/2023    9:42 AM 05/16/2022   11:25 AM  6CIT Screen  What Year? 0 points 0 points 0 points  What month? 0 points 0 points 0 points  What time? 0 points 0 points 0 points  Count back from 20 0 points 0 points 0 points  Months in reverse 0 points 0 points 0 points  Repeat phrase 2 points 0 points 0 points  Total Score 2 points 0 points 0 points    Immunizations Immunization History  Administered Date(s) Administered   INFLUENZA, HIGH DOSE SEASONAL PF 05/31/2023   Influenza-Unspecified 06/22/2014, 07/09/2017, 07/21/2020, 06/23/2022, 05/25/2024   Moderna Covid-19 Fall Seasonal Vaccine 65yrs & older 05/31/2023   PFIZER Comirnaty(Gray Top)Covid-19 Tri-Sucrose Vaccine 12/19/2020   PFIZER(Purple Top)SARS-COV-2 Vaccination 10/16/2019, 11/12/2019, 05/09/2020   PNEUMOCOCCAL CONJUGATE-20 01/12/2012   Pneumococcal Conjugate-13 12/15/2013, 06/06/2014, 05/06/2019  Tdap 06/10/2021   Zoster Recombinant(Shingrix) 01/30/2021   Zoster, Live 01/12/2012    Screening Tests Health Maintenance  Topic Date Due    Hepatitis C Screening  Never done   Zoster Vaccines- Shingrix (2 of 2) 03/27/2021   COVID-19 Vaccine (6 - 2024-25 season) 05/23/2024   Medicare Annual Wellness (AWV)  06/01/2025   DTaP/Tdap/Td (2 - Td or Tdap) 06/11/2031   Pneumococcal Vaccine: 50+ Years  Completed   Influenza Vaccine  Completed   HPV VACCINES  Aged Out   Meningococcal B Vaccine  Aged Out    Health Maintenance Items Addressed: See Nurse Notes at the end of this note  Additional Screening:  Vision Screening: Recommended annual ophthalmology exams for early detection of glaucoma and other disorders of the eye. Is the patient up to date with their annual eye exam?  Yes  Who is the provider or what is the name of the office in which the patient attends annual eye exams? [facility], [place]  Dental Screening: Recommended annual dental exams for proper oral hygiene  Community Resource Referral / Chronic Care Management: CRR required this visit?  No   CCM required this visit?  No   Plan:    I have personally reviewed and noted the following in the patient's chart:   Medical and social history Use of alcohol, tobacco or illicit drugs  Current medications and supplements including opioid prescriptions. Patient is not currently taking opioid prescriptions. Functional ability and status Nutritional status Physical activity Advanced directives List of other physicians Hospitalizations, surgeries, and ER visits in previous 12 months Vitals Screenings to include cognitive, depression, and falls Referrals and appointments  In addition, I have reviewed and discussed with patient certain preventive protocols, quality metrics, and best practice recommendations. A written personalized care plan for preventive services as well as general preventive health recommendations were provided to patient.   Vina Ned, CMA   06/01/2024   After Visit Summary: (MyChart) Due to this being a telephonic visit, the after  visit summary with patients personalized plan was offered to patient via MyChart   Notes:  6 CIT Score - 2 Needs 2nd Shingrix vaccine (pharmacy) Will get Covid vaccine (RX sent to [facility]) Screening colonoscopy no longer recommended due to age

## 2024-06-01 NOTE — Telephone Encounter (Signed)
 Copied from CRM [id number]. Topic: General - Other >> Jun 01, 2024 10:05 AM Aleatha C wrote: Reason for CRM: patient would like a prescription for a new covid shot pfizer   [facility] [phone] [place] [place] - [address] [place] [address] Phone: [phone] Fax: [phone]

## 2024-06-01 NOTE — Patient Instructions (Signed)
 Noah Ayala,  Thank you for taking the time for your Medicare Wellness Visit. I appreciate your continued commitment to your health goals. Please review the care plan we discussed, and feel free to reach out if I can assist you further.  Medicare recommends these wellness visits once per year to help you and your care team stay ahead of potential health issues. These visits are designed to focus on prevention, allowing your provider to concentrate on managing your acute and chronic conditions during your regular appointments.  Please note that Annual Wellness Visits do not include a physical exam. Some assessments may be limited, especially if the visit was conducted virtually. If needed, we may recommend a separate in-person follow-up with your provider.  Ongoing Care Seeing your primary care provider every 3 to 6 months helps us  monitor your health and provide consistent, personalized care.   Referrals If a referral was made during today's visit and you haven't received any updates within two weeks, please contact the referred provider directly to check on the status.  Recommended Screenings:  Get the 2nd Shingrix vaccine at your convenience.   Health Maintenance  Topic Date Due   Hepatitis C Screening  Never done   Zoster (Shingles) Vaccine (2 of 2) 03/27/2021   COVID-19 Vaccine (6 - 2024-25 season) 05/23/2024   Medicare Annual Wellness Visit  06/01/2025   DTaP/Tdap/Td vaccine (2 - Td or Tdap) 06/11/2031   Pneumococcal Vaccine for age over 19  Completed   Flu Shot  Completed   HPV Vaccine  Aged Out   Meningitis B Vaccine  Aged Out       06/01/2024    9:00 AM  Advanced Directives  Does Patient Have a Medical Advance Directive? Yes  Type of Estate agent of [place];Living will  Does patient want to make changes to medical advance directive? No - Patient declined  Copy of Healthcare Power of Attorney in Chart? No - copy requested   Advance Care Planning is  important because it: Ensures you receive medical care that aligns with your values, goals, and preferences. Provides guidance to your family and loved ones, reducing the emotional burden of decision-making during critical moments.  Vision: Annual vision screenings are recommended for early detection of glaucoma, cataracts, and diabetic retinopathy. These exams can also reveal signs of chronic conditions such as diabetes and high blood pressure.  Dental: Annual dental screenings help detect early signs of oral cancer, gum disease, and other conditions linked to overall health, including heart disease and diabetes.  Please see the attached documents for additional preventive care recommendations.   Fall Prevention in the Home, Adult Falls can cause injuries and affect people of all ages. There are many simple things that you can do to make your home safe and to help prevent falls. If you need it, ask for help making these changes. What actions can I take to prevent falls? General information Use good lighting in all rooms. Make sure to: Replace any light bulbs that burn out. Turn on lights if it is dark and use night-lights. Keep items that you use often in easy-to-reach places. Lower the shelves around your home if needed. Move furniture so that there are clear paths around it. Do not keep throw rugs or other things on the floor that can make you trip. If any of your floors are uneven, fix them. Add color or contrast paint or tape to clearly mark and help you see: Grab bars or handrails. First and  last steps of staircases. Where the edge of each step is. If you use a ladder or stepladder: Make sure that it is fully opened. Do not climb a closed ladder. Make sure the sides of the ladder are locked in place. Have someone hold the ladder while you use it. Know where your pets are as you move through your home. What can I do in the bathroom?     Keep the floor dry. Clean up any water  that is on the floor right away. Remove soap buildup in the bathtub or shower. Buildup makes bathtubs and showers slippery. Use non-skid mats or decals on the floor of the bathtub or shower. Attach bath mats securely with double-sided, non-slip rug tape. If you need to sit down while you are in the shower, use a non-slip stool. Install grab bars by the toilet and in the bathtub and shower. Do not use towel bars as grab bars. What can I do in the bedroom? Make sure that you have a light by your bed that is easy to reach. Do not use any sheets or blankets on your bed that hang to the floor. Have a firm bench or chair with side arms that you can use for support when you get dressed. What can I do in the kitchen? Clean up any spills right away. If you need to reach something above you, use a sturdy step stool that has a grab bar. Keep electrical cables out of the way. Do not use floor polish or wax that makes floors slippery. What can I do with my stairs? Do not leave anything on the stairs. Make sure that you have a light switch at the top and the bottom of the stairs. Have them installed if you do not have them. Make sure that there are handrails on both sides of the stairs. Fix handrails that are broken or loose. Make sure that handrails are as long as the staircases. Install non-slip stair treads on all stairs in your home if they do not have carpet. Avoid having throw rugs at the top or bottom of stairs, or secure the rugs with carpet tape to prevent them from moving. Choose a carpet design that does not hide the edge of steps on the stairs. Make sure that carpet is firmly attached to the stairs. Fix any carpet that is loose or worn. What can I do on the outside of my home? Use bright outdoor lighting. Repair the edges of walkways and driveways and fix any cracks. Clear paths of anything that can make you trip, such as tools or rocks. Add color or contrast paint or tape to clearly mark and  help you see high doorway thresholds. Trim any bushes or trees on the main path into your home. Check that handrails are securely fastened and in good repair. Both sides of all steps should have handrails. Install guardrails along the edges of any raised decks or porches. Have leaves, snow, and ice cleared regularly. Use sand, salt, or ice melt on walkways during winter months if you live where there is ice and snow. In the garage, clean up any spills right away, including grease or oil spills. What other actions can I take? Review your medicines with your health care provider. Some medicines can make you confused or feel dizzy. This can increase your chance of falling. Wear closed-toe shoes that fit well and support your feet. Wear shoes that have rubber soles and low heels. Use a cane, walker, scooter,  or crutches that help you move around if needed. Talk with your provider about other ways that you can decrease your risk of falls. This may include seeing a physical therapist to learn to do exercises to improve movement and strength. Where to find more information Centers for Disease Control and Prevention, STEADI: TonerPromos.no [employer] on Aging: BaseRingTones.pl National Institute on Aging: BaseRingTones.pl Contact a health care provider if: You are afraid of falling at home. You feel weak, drowsy, or dizzy at home. You fall at home. Get help right away if you: Lose consciousness or have trouble moving after a fall. Have a fall that causes a head injury. These symptoms may be an emergency. Get help right away. Call 911. Do not wait to see if the symptoms will go away. Do not drive yourself to the hospital. This information is not intended to replace advice given to you by your health care provider. Make sure you discuss any questions you have with your health care provider. Document Revised: 05/12/2022 Document Reviewed: 05/12/2022 Elsevier Patient Education  2024 [employer].

## 2024-06-01 NOTE — Telephone Encounter (Signed)
 Prescription sent.  KP

## 2024-07-26 ENCOUNTER — Encounter: Payer: Self-pay | Admitting: Urology

## 2024-07-26 ENCOUNTER — Ambulatory Visit (INDEPENDENT_AMBULATORY_CARE_PROVIDER_SITE_OTHER): Admitting: Urology

## 2024-07-26 ENCOUNTER — Ambulatory Visit
Admission: RE | Admit: 2024-07-26 | Discharge: 2024-07-26 | Disposition: A | Discharge status: Home / Self Care | Attending: Urology | Admitting: Urology

## 2024-07-26 ENCOUNTER — Ambulatory Visit
Admission: RE | Admit: 2024-07-26 | Discharge: 2024-07-26 | Disposition: A | Discharge status: Home / Self Care | Source: Ambulatory Visit | Attending: Urology | Admitting: Urology

## 2024-07-26 VITALS — BP 134/79 | HR 80 | Ht 70.0 in | Wt 200.0 lb

## 2024-07-26 DIAGNOSIS — N401 Enlarged prostate with lower urinary tract symptoms: Secondary | ICD-10-CM

## 2024-07-26 DIAGNOSIS — R972 Elevated prostate specific antigen [PSA]: Secondary | ICD-10-CM

## 2024-07-26 DIAGNOSIS — Z87442 Personal history of urinary calculi: Secondary | ICD-10-CM

## 2024-07-26 DIAGNOSIS — N5201 Erectile dysfunction due to arterial insufficiency: Secondary | ICD-10-CM | POA: Diagnosis not present

## 2024-07-26 MED ORDER — SILDENAFIL CITRATE 50 MG PO TABS: ORAL_TABLET | ORAL | 0 refills | Status: AC

## 2024-07-26 NOTE — Patient Instructions (Signed)
 [email or web address]

## 2024-07-26 NOTE — Progress Notes (Signed)
 07/26/2024 1:23 PM   Noah Ayala [birth year] [id number]  Referring provider: Joshua Cathryne BROCKS, MD No address on file  Chief Complaint  Patient presents with   Follow-up   Urologic history:  1. History of gross hematuria Gross hematuria evaluations while living in [place] in 2014, 2017, and 2018, which showed no significant abnormalities. Bladder biopsy in 2017 was negative Reevaluation 01/2022 with CTU showing 5 mm non-obstructing left renal calculus and cystoscopy remarkable for prominent lateral lobe enlargement with hypervascularity   2. Elevated PSA Benign prostate biopsy in 2015, PSA 5.9   3. BPH with LUTS Previously on finasteride /tamsulosin -discontinued 2025 CTU 2023 showed a prostate volume of 124 cc's   4. Nephrolithiasis CTU 11/2021 with 5 mm non-obstructing left renal calculus SWL 07/02/2023 after migration of his left ureteral calculus to the proximal ureter  HPI: Noah Ayala is a 78 y.o. male who presents for annual follow-up.  He discontinued finasteride  April 2025 and has not seen any worsening of his lower urinary tract symptoms No flank, abdominal or pelvic pain Tadalafil  20 mg was not effective for ED and he is interested in a trial of sildenafil  PSA checked by PCP 02/2024 was 4.5  PMH: Past Medical History:  Diagnosis Date   Hypertension     Surgical History: Past Surgical History:  Procedure Laterality Date   EXTRACORPOREAL SHOCK WAVE LITHOTRIPSY Left 07/02/2023   Procedure: EXTRACORPOREAL SHOCK WAVE LITHOTRIPSY (ESWL);  Surgeon: Twylla Noah BROCKS, MD;  Location: [facility] ORS;  Service: Urology;  Laterality: Left;    Home Medications:  Allergies as of 07/26/2024   No Known Allergies      Medication List        Accurate as of July 26, 2024  1:23 PM. If you have any questions, ask your nurse or doctor.          cholecalciferol 25 MCG (1000 UNIT) tablet Commonly known as: VITAMIN D3 Take 5,000 Units by mouth daily.   finasteride   5 MG tablet Commonly known as: PROSCAR  TAKE 1 TABLET (5 MG TOTAL) BY MOUTH DAILY.   losartan  50 MG tablet Commonly known as: COZAAR  Take 1 tablet (50 mg total) by mouth daily.   mirabegron  ER 25 MG Tb24 tablet Commonly known as: MYRBETRIQ  Take 1 tablet (25 mg total) by mouth daily.   sildenafil  50 MG tablet Commonly known as: Viagra  1-2 tabs 1 hour prior  to sexual intercourse   simvastatin  40 MG tablet Commonly known as: ZOCOR  Take 0.5 tablets (20 mg total) by mouth at bedtime. 1/2 tablet daily   tadalafil  20 MG tablet Commonly known as: CIALIS  Take 1 tablet 1 hour prior to intercourse.        Allergies: No Known Allergies  Family History: Family History  Problem Relation Age of Onset   Heart disease Mother    Arthritis Father    Heart disease Father     Social History:  reports that he has never smoked. He has never used smokeless tobacco. He reports that he does not currently use alcohol. He reports that he does not currently use drugs.   Physical Exam: BP 134/79   Pulse 80   Ht 5' 10 (1.778 m)   Wt 200 lb (90.7 kg)   BMI 28.70 kg/m   Constitutional:  Alert, No acute distress. HEENT: [place] AT Respiratory: Normal respiratory effort, no increased work of breathing. Psychiatric: Normal mood and affect.   Assessment & Plan:    1.  Elevated PSA Prior biopsy 2015 for PSA  of 5.9 Most recent PSA stable 4.5 Based on prostate cancer screening guidelines which do not recommend continue prostate cancer screening after age 87 would recommend discontinuing PSA checks based on long-term stability and the low incidence of high-grade prostate cancer in his age group  2.  BPH with LUTS Off tamsulosin  and finasteride  No bothersome LUTS  3.  History nephrolithiasis KUB performed today was personally reviewed and interpreted.  No calcification suspicious for urinary tract stones identified  4.  Erectile dysfunction Rx sildenafil  50 mg; 1-2 1 hour prior to intercourse  sent to pharmacy  Continue annual follow-up   Noah JAYSON Barba, MD  [facility] [address], [address] [place], [place] [address] [phone]

## 2024-07-28 ENCOUNTER — Ambulatory Visit: Payer: Self-pay | Admitting: Urology

## 2024-09-02 ENCOUNTER — Ambulatory Visit: Admitting: Family Medicine

## 2024-09-02 ENCOUNTER — Encounter: Payer: Self-pay | Admitting: Family Medicine

## 2024-09-02 VITALS — BP 130/70 | HR 73 | Ht 70.0 in | Wt 209.0 lb

## 2024-09-02 DIAGNOSIS — R0789 Other chest pain: Secondary | ICD-10-CM

## 2024-09-02 DIAGNOSIS — Z79899 Other long term (current) drug therapy: Secondary | ICD-10-CM

## 2024-09-02 DIAGNOSIS — I10 Essential (primary) hypertension: Secondary | ICD-10-CM

## 2024-09-02 DIAGNOSIS — E785 Hyperlipidemia, unspecified: Secondary | ICD-10-CM | POA: Diagnosis not present

## 2024-09-02 DIAGNOSIS — Z131 Encounter for screening for diabetes mellitus: Secondary | ICD-10-CM

## 2024-09-02 MED ORDER — SIMVASTATIN 40 MG PO TABS: 20.0000 mg | ORAL_TABLET | Freq: Every day | ORAL | 1 refills | Status: DC

## 2024-09-02 MED ORDER — LOSARTAN POTASSIUM 50 MG PO TABS: 50.0000 mg | ORAL_TABLET | Freq: Every day | ORAL | 1 refills | Status: AC

## 2024-09-02 NOTE — Progress Notes (Signed)
 Established Patient Office Visit  Patient ID: Noah Ayala, male    DOB: 1946/07/10  Age: 78 y.o. MRN: [id number] PCP: Marvin Grabill K, MD  Chief Complaint  Patient presents with   Hypertension   Medical Management of Chronic Issues    Patient presents today for follow up on HTN, and to get routine labs. He is also wanting to discuss a stress test.     Subjective:     HPI  Discussed the use of AI scribe software for clinical note transcription with the patient, who gave verbal consent to proceed.  History of Present Illness Noah Ayala is a 78 year old male who presents for a semi-annual follow-up and requests a stress test.  He has not had a stress test in several years, with the last one ordered by his primary care doctor in [place] about ten years ago due to concerns about chest pressure and discomfort during physical activity, such as swimming. He experiences occasional chest pressure, most recently a couple of nights ago while sitting at night.  He has a significant family history of heart problems, with both parents having died from heart-related issues; his father at age 64 and his mother at age 92.  He has been on losartan  for approximately thirty years and also takes simvastatin . He mentions needing a new prescription for losartan . Additionally, he takes CoQ10.  He recently retired from his position as a social research officer, government and is adjusting to the change, noting it feels 'weird' not to have work-related phone calls. He is considering volunteer opportunities and recently went on a river cruise in [place].  He has some skin concerns, including a large mole in his armpit that was evaluated by a dermatologist a month ago and deemed non-cancerous, though he is concerned about its size and potential for bleeding or infection.     Review of Systems  All other systems reviewed and are negative.     Objective:     BP 130/70   Pulse 73   Ht 5' 10 (1.778 m)    Wt 209 lb (94.8 kg)   SpO2 98%   BMI 29.99 kg/m     Physical Exam Vitals and nursing note reviewed.  Constitutional:      Appearance: Normal appearance.  HENT:     Head: Normocephalic.     Right Ear: External ear normal.     Left Ear: External ear normal.  Eyes:     Conjunctiva/sclera: Conjunctivae normal.  Cardiovascular:     Rate and Rhythm: Normal rate.  Pulmonary:     Effort: Pulmonary effort is normal. No respiratory distress.  Abdominal:     Palpations: Abdomen is soft.  Musculoskeletal:        General: Normal range of motion.  Skin:    General: Skin is warm.  Neurological:     Mental Status: He is alert and oriented to person, place, and time.  Psychiatric:        Mood and Affect: Mood normal.     Physical Exam CARDIOVASCULAR: Heart sounds normal. SKIN: Skin normal.   No results found for any visits on 09/02/24.     The 10-year ASCVD risk score (Arnett DK, et al., 2019) is: 35.6%    Assessment & Plan:   Problem List Items Addressed This Visit   None Visit Diagnoses       Encounter for long-term current use of medication    -  Primary   Relevant Orders   Comprehensive  Metabolic Panel (CMET)   CBC with Differential     Primary hypertension       Relevant Medications   losartan  (COZAAR ) 50 MG tablet   simvastatin  (ZOCOR ) 40 MG tablet   Other Relevant Orders   Ambulatory referral to Cardiology     Hyperlipidemia, unspecified hyperlipidemia type       Relevant Medications   losartan  (COZAAR ) 50 MG tablet   simvastatin  (ZOCOR ) 40 MG tablet     Diabetes mellitus screening       Relevant Orders   Hemoglobin A1c     Chest pressure       Relevant Orders   Ambulatory referral to Cardiology       Assessment and Plan Assessment & Plan Chest pain Intermittent chest pressure with family history of heart disease. Intermittent symptoms. Stress test not indicated. - Placed cardiology referral for further evaluation and potential stress  test.  Benign essential hypertension Long-standing hypertension managed with losartan . - Continue losartan  50 MG oral daily. - Called in prescription refill for losartan .  Hyperlipidemia Managed with simvastatin . - Continue simvastatin  40 MG oral daily. - Called in prescription refill for simvastatin .  General Health Maintenance Routine health maintenance discussed, including A1C testing for diabetes monitoring. - Ordered A1C test at the lab next door.    No follow-ups on file.    Vinary K Kerline Trahan, MD [facility] at [facility]

## 2024-09-03 LAB — COMPREHENSIVE METABOLIC PANEL WITH GFR
ALT: 12 IU/L (ref 0–44)
AST: 13 IU/L (ref 0–40)
Albumin: 4.4 g/dL (ref 3.8–4.8)
Alkaline Phosphatase: 62 IU/L (ref 47–123)
BUN/Creatinine Ratio: 16 (ref 10–24)
BUN: 16 mg/dL (ref 8–27)
Bilirubin Total: 0.4 mg/dL (ref 0.0–1.2)
CO2: 27 mmol/L (ref 20–29)
Calcium: 9.5 mg/dL (ref 8.6–10.2)
Chloride: 103 mmol/L (ref 96–106)
Creatinine, Ser: 0.97 mg/dL (ref 0.76–1.27)
Globulin, Total: 2.3 g/dL (ref 1.5–4.5)
Glucose: 99 mg/dL (ref 70–99)
Potassium: 5 mmol/L (ref 3.5–5.2)
Sodium: 142 mmol/L (ref 134–144)
Total Protein: 6.7 g/dL (ref 6.0–8.5)
eGFR: 80 mL/min/1.73 (ref >59)

## 2024-09-03 LAB — CBC WITH DIFFERENTIAL/PLATELET
Basophils Absolute: 0.1 x10E3/uL (ref 0.0–0.2)
Basos: 1 %
EOS (ABSOLUTE): 0.2 x10E3/uL (ref 0.0–0.4)
Eos: 2 %
Hematocrit: 46.5 % (ref 37.5–51.0)
Hemoglobin: 14.4 g/dL (ref 13.0–17.7)
Immature Grans (Abs): 0 x10E3/uL (ref 0.0–0.1)
Immature Granulocytes: 0 %
Lymphocytes Absolute: 2.7 x10E3/uL (ref 0.7–3.1)
Lymphs: 26 %
MCH: 26.9 pg (ref 26.6–33.0)
MCHC: 31 g/dL — ABNORMAL LOW (ref 31.5–35.7)
MCV: 87 fL (ref 79–97)
Monocytes Absolute: 1 x10E3/uL — ABNORMAL HIGH (ref 0.1–0.9)
Monocytes: 10 %
Neutrophils Absolute: 6.5 x10E3/uL (ref 1.4–7.0)
Neutrophils: 61 %
Platelets: 265 x10E3/uL (ref 150–450)
RBC: 5.35 x10E6/uL (ref 4.14–5.80)
RDW: 14.1 % (ref 11.6–15.4)
WBC: 10.6 x10E3/uL (ref 3.4–10.8)

## 2024-09-03 LAB — HEMOGLOBIN A1C
Est. average glucose Bld gHb Est-mCnc: 134 mg/dL
Hgb A1c MFr Bld: 6.3 % — ABNORMAL HIGH (ref 4.8–5.6)

## 2024-09-05 ENCOUNTER — Ambulatory Visit: Payer: Self-pay | Admitting: Family Medicine

## 2024-09-20 ENCOUNTER — Other Ambulatory Visit: Payer: Self-pay | Admitting: Family Medicine

## 2024-09-20 ENCOUNTER — Telehealth: Payer: Self-pay

## 2024-09-20 ENCOUNTER — Ambulatory Visit: Payer: Self-pay

## 2024-09-20 DIAGNOSIS — E785 Hyperlipidemia, unspecified: Secondary | ICD-10-CM

## 2024-09-20 DIAGNOSIS — I10 Essential (primary) hypertension: Secondary | ICD-10-CM

## 2024-09-20 NOTE — Telephone Encounter (Signed)
 Called [facility] to discuss patient's medication. Pharmacist informed me that are not on automatic refill. They did receive RX from 09/02/2024. Will get Rx filled for patient.   Patient was made aware and verbalized understanding.

## 2024-09-20 NOTE — Telephone Encounter (Signed)
 FYI Only or Action Required?: Action required by provider: medication refill request. Requesting medications, has not tested for the flu, afebrile.   Patient was last seen in primary care on 09/02/2024 by Kotturi, Vinay K, MD.  Called Nurse Triage reporting Cough.  Symptoms began yesterday.  Interventions attempted: Nothing.  Symptoms are: unchanged.  Triage Disposition: Home Care  Patient/caregiver understands and will follow disposition?: No  Reason for Disposition  Cough with cold symptoms (e.g., runny nose, postnasal drip, throat clearing)  Answer Assessment - Initial Assessment Questions 1. ONSET: When did the cough begin?      yesterday 2. SEVERITY: How bad is the cough today?      Moderate today 3. SPUTUM: Describe the color of your sputum (e.g., none, dry cough; clear, white, yellow, green)     denies 4. HEMOPTYSIS: Are you coughing up any blood? If Yes, ask: How much? (e.g., flecks, streaks, tablespoons, etc.)     denies 5. DIFFICULTY BREATHING: Are you having difficulty breathing? If Yes, ask: How bad is it? (e.g., mild, moderate, severe)      denies 6. FEVER: Do you have a fever? If Yes, ask: What is your temperature, how was it measured, and when did it start?     denies 10. OTHER SYMPTOMS: Do you have any other symptoms? (e.g., runny nose, wheezing, chest pain)       Stuffy nose  Pt requesting tamiflu and benzonatate tablets. Pt has not been tested for the flu, nor has he been evaluated by a provider for current s/s. Pt declined appt, everyone around me has the flu, I don't want an appt, I just want the medications. Routing to provider, please call back with decision.  Protocols used: Cough - Acute Productive-A-AH

## 2024-09-20 NOTE — Telephone Encounter (Signed)
 Copied from CRM #[id number]. Topic: Clinical - Prescription Issue >> Sep 20, 2024 12:04 PM Rosaria BRAVO wrote: Reason for CRM: Pt called reporting that his pharmacy told him that they do not have his simvastatin  or losartan . Pt is asking for the clinic to advise and contact the pharmacy.   [id number] [place], [place] - [address] [place] [address] Phone: [phone] Fax: [phone]  Please contact patient after as well  Best contact: [phone]

## 2024-09-20 NOTE — Telephone Encounter (Signed)
 Can you take a look at this and advise. Thank you.  JM

## 2024-09-21 ENCOUNTER — Telehealth: Admitting: Nurse Practitioner

## 2024-09-21 DIAGNOSIS — Z20828 Contact with and (suspected) exposure to other viral communicable diseases: Secondary | ICD-10-CM | POA: Diagnosis not present

## 2024-09-21 DIAGNOSIS — R6889 Other general symptoms and signs: Secondary | ICD-10-CM | POA: Diagnosis not present

## 2024-09-21 MED ORDER — BENZONATATE 100 MG PO CAPS: 100.0000 mg | ORAL_CAPSULE | Freq: Three times a day (TID) | ORAL | 0 refills | Status: AC | PRN

## 2024-09-21 MED ORDER — OSELTAMIVIR PHOSPHATE 75 MG PO CAPS: 75.0000 mg | ORAL_CAPSULE | Freq: Two times a day (BID) | ORAL | 0 refills | Status: AC

## 2024-09-21 NOTE — Telephone Encounter (Signed)
 Recommend sick visit. Needs to be tested for Flu.

## 2024-09-21 NOTE — Progress Notes (Signed)
 E visit for Flu like symptoms   We are sorry that you are not feeling well.  Here is how we plan to help! Based on what you have shared with me it looks like you may have a respiratory virus that may be influenza.  Influenza or the flu is  an infection caused by a respiratory virus. The flu virus is highly contagious and persons who did not receive their yearly flu vaccination may catch the flu from close contact.  We have anti-viral medications to treat the viruses that cause this infection. They are not a cure and only shorten the course of the infection. These prescriptions are most effective when they are given within the first 2 days of flu symptoms. Antiviral medications are indicated if you have a high risk of complications from the flu. You should  also consider an antiviral medication if you are in close contact with someone who is at risk. These medications can help patients avoid complications from the flu but have side effects that you should know.   Possible side effects from Tamiflu  or oseltamivir  include nausea, vomiting, diarrhea, dizziness, headaches, eye redness, sleep problems or other respiratory symptoms. You should not take Tamiflu  if you have an allergy to oseltamivir  or any to the ingredients in Tamiflu .  Based upon your symptoms and potential risk factors I have prescribed Oseltamivir  (Tamiflu ).  It has been sent to your designated pharmacy.  You will take one 75 mg capsule orally twice a day for the next 5 days.   For nasal congestion, you may use an oral decongestant such as Mucinex D or if you have glaucoma or high blood pressure use plain Mucinex.  Saline nasal spray or nasal drops can help and can safely be used as often as needed for congestion.  If you have a sore or scratchy throat, use a saltwater gargle-  to  teaspoon of salt dissolved in a 4-ounce to 8-ounce glass of warm water.  Gargle the solution for approximately 15-30 seconds and then spit.  It is  important not to swallow the solution.  You can also use throat lozenges/cough drops and Chloraseptic spray to help with throat pain or discomfort.  Warm or cold liquids can also be helpful in relieving throat pain.  For headache, pain or general discomfort, you can use Ibuprofen  or Tylenol  as directed.   Some authorities believe that zinc sprays or the use of Echinacea may shorten the course of your symptoms.  I have prescribed the following medications to help lessen symptoms: I have prescribed Tessalon  Perles 100 mg. You may take 1-2 capsules every 8 hours as needed for cough  You are to isolate at home until you have been fever-free for at least 24 hours without a fever-reducing medication, and symptoms have been steadily improving for 24 hours.  If you must be around other household members who do not have symptoms, you need to make sure that both you and the family members are masking consistently with a high-quality mask.  If you note any worsening of symptoms despite treatment, please seek an in-person evaluation ASAP. If you note any significant shortness of breath or any chest pain, please seek ED evaluation. Please do not delay care!  ANYONE WHO HAS FLU SYMPTOMS SHOULD: Stay home. The flu is highly contagious and going out or to work exposes others! Be sure to drink plenty of fluids. Water is fine as well as fruit juices, sodas and electrolyte beverages. You may want to stay  away from caffeine or alcohol. If you are nauseated, try taking small sips of liquids. How do you know if you are getting enough fluid? Your urine should be a pale yellow or almost colorless. Get rest. Taking a steamy shower or using a humidifier may help nasal congestion and ease sore throat pain. Using a saline nasal spray works much the same way. Cough drops, hard candies and sore throat lozenges may ease your cough. Line up a caregiver. Have someone check on you regularly.  GET HELP RIGHT AWAY IF: You cannot  keep down liquids or your medications. You become short of breath Your fell like you are going to pass out or loose consciousness. Your symptoms persist after you have completed your treatment plan  MAKE SURE YOU  Understand these instructions. Will watch your condition. Will get help right away if you are not doing well or get worse.  Your e-visit answers were reviewed by a board certified advanced clinical practitioner to complete your personal care plan.  Depending on the condition, your plan could have included both over the counter or prescription medications.  If there is a problem please reply  once you have received a response from your provider.  Your safety is important to us .  If you have drug allergies check your prescription carefully.    You can use MyChart to ask questions about todays visit, request a non-urgent call back, or ask for a work or school excuse for 24 hours related to this e-Visit. If it has been greater than 24 hours you will need to follow up with your provider, or enter a new e-Visit to address those concerns.  You will get an e-mail in the next two days asking about your experience.  I hope that your e-visit has been valuable and will speed your recovery. Thank you for using e-visits.   I have spent 5 minutes in review of e-visit questionnaire, review and updating patient chart, medical decision making and response to patient.   Lauraine Kitty, FNP

## 2024-09-22 NOTE — Telephone Encounter (Signed)
 Already refilled on 12/15225 in a separate refill encounter. Will refuse this request due to this.  Requested Prescriptions  Pending Prescriptions Disp Refills   simvastatin  (ZOCOR ) 40 MG tablet 45 tablet 1    Sig: Take 0.5 tablets (20 mg total) by mouth at bedtime. 1/2 tablet daily     Cardiovascular:  Antilipid - Statins Failed - 09/22/2024  8:31 AM      Failed - Lipid Panel in normal range within the last 12 months    Cholesterol, Total  Date Value Ref Range Status  03/03/2024 187 100 - 199 mg/dL Final   LDL Chol Calc (NIH)  Date Value Ref Range Status  03/03/2024 83 0 - 99 mg/dL Final   HDL  Date Value Ref Range Status  03/03/2024 40 >39 mg/dL Final   Triglycerides  Date Value Ref Range Status  03/03/2024 396 (H) 0 - 149 mg/dL Final         Passed - Patient is not pregnant      Passed - Valid encounter within last 12 months    Recent Outpatient Visits           2 weeks ago Encounter for long-term current use of medication   [place] [facility] Kotturi, Vinay K, MD   6 months ago Primary hypertension   [place] [facility] at [facility], Vinay K, MD       Future Appointments             In 1 week Argentina Clap, MD [facility] at [place]   In 10 months Stoioff, Glendia BROCKS, MD [facility] [place]             losartan  (COZAAR ) 50 MG tablet 90 tablet 1    Sig: Take 1 tablet (50 mg total) by mouth daily.     Cardiovascular:  Angiotensin Receptor Blockers Passed - 09/22/2024  8:31 AM      Passed - Cr in normal range and within 180 days    Creatinine, Ser  Date Value Ref Range Status  09/02/2024 0.97 0.76 - 1.27 mg/dL Final         Passed - K in normal range and within 180 days    Potassium  Date Value Ref Range Status  09/02/2024 5.0 3.5 - 5.2 mmol/L Final         Passed - Patient is not pregnant      Passed - Last BP in normal range    BP Readings  from Last 1 Encounters:  09/02/24 130/70         Passed - Valid encounter within last 6 months    Recent Outpatient Visits           2 weeks ago Encounter for long-term current use of medication   [facility] at [facility], Vinay K, MD   6 months ago Primary hypertension   [facility] at [facility], Vinay K, MD       Future Appointments             In 1 week Argentina Clap, MD [facility] at [place]   In 10 months Stoioff, Glendia BROCKS, MD [facility] [place]

## 2024-09-27 NOTE — Telephone Encounter (Signed)
 LMOM for patient to make appt to come in.  JM

## 2024-09-29 ENCOUNTER — Encounter: Payer: Self-pay | Admitting: Family Medicine

## 2024-09-29 ENCOUNTER — Ambulatory Visit: Admitting: Family Medicine

## 2024-09-29 ENCOUNTER — Ambulatory Visit (INDEPENDENT_AMBULATORY_CARE_PROVIDER_SITE_OTHER): Admitting: Family Medicine

## 2024-09-29 ENCOUNTER — Other Ambulatory Visit: Payer: Self-pay | Admitting: Family Medicine

## 2024-09-29 VITALS — BP 124/72 | HR 62 | Ht 70.0 in | Wt 203.0 lb

## 2024-09-29 DIAGNOSIS — N401 Enlarged prostate with lower urinary tract symptoms: Secondary | ICD-10-CM | POA: Diagnosis not present

## 2024-09-29 DIAGNOSIS — E785 Hyperlipidemia, unspecified: Secondary | ICD-10-CM

## 2024-09-29 MED ORDER — SIMVASTATIN 40 MG PO TABS: 40.0000 mg | ORAL_TABLET | Freq: Every day | ORAL | 1 refills | Status: DC

## 2024-09-29 MED ORDER — TAMSULOSIN HCL 0.4 MG PO CAPS: 0.4000 mg | ORAL_CAPSULE | Freq: Every day | ORAL | 3 refills | Status: AC

## 2024-09-29 NOTE — Progress Notes (Signed)
 "  Acute Office Visit  Subjective:     Patient ID: Noah Ayala, male    DOB: [birth year], 79 y.o.   MRN: [id number]  Chief Complaint  Patient presents with   Urinary Retention    Patient states he urinates just drops, not much urine output today   Medication Dose Change    Simvastatin , wants to increase to 1 whole tablet instead of 1/2    HPI Patient is in today for  Discussed the use of AI scribe software for clinical note transcription with the patient, who gave verbal consent to proceed.  History of Present Illness Noah Ayala is a 79 year old male who presents with acute urinary retention.  He experienced acute urinary retention, waking up during the night with an inability to urinate despite feeling the urge. This persisted into the morning, causing significant discomfort. He attempted to drink water to stimulate urination but was unsuccessful until around 1 PM when he finally began to urinate, initially experiencing only drips and sharp pain at the tip of the penis.  He has not taken his Flomax  prescription for over a year, as he had not experienced symptoms and did not renew the prescription after it finished. He last saw his urologist in the fall but did not discuss the medication as he was asymptomatic at the time.  He is currently taking simvastatin , which he continued after returning from [place] . He notes that his triglycerides are high.  During the review of symptoms, he denies any burning sensation during urination but confirms difficulty initiating urination and pain at the tip of the penis. He was unable to provide a urine sample at the time of the visit.    Review of Systems  All other systems reviewed and are negative.       Objective:    BP 124/72   Pulse 62   Ht 5' 10 (1.778 m)   Wt 203 lb (92.1 kg)   SpO2 95%   BMI 29.13 kg/m     Physical Exam Vitals and nursing note reviewed.  Constitutional:      Appearance: Normal appearance.   HENT:     Head: Normocephalic.     Right Ear: External ear normal.     Left Ear: External ear normal.  Eyes:     Conjunctiva/sclera: Conjunctivae normal.  Cardiovascular:     Rate and Rhythm: Normal rate.  Pulmonary:     Effort: Pulmonary effort is normal. No respiratory distress.  Abdominal:     Palpations: Abdomen is soft.  Musculoskeletal:        General: Normal range of motion.  Skin:    General: Skin is warm.  Neurological:     Mental Status: He is alert and oriented to person, place, and time.  Psychiatric:        Mood and Affect: Mood normal.     Results for orders placed or performed in visit on 09/29/24  Microscopic Examination  Result Value Ref Range   WBC, UA None seen 0 - 5 /hpf   RBC, Urine None seen 0 - 2 /hpf   Epithelial Cells (non renal) None seen 0 - 10 /hpf   Casts None seen None seen /lpf   Bacteria, UA None seen None seen/Few  UA/M w/rflx Culture, Routine   Specimen: Urine  Result Value Ref Range   Specific Gravity, UA 1.009 1.005 - 1.030   pH, UA 6.5 5.0 - 7.5   Color, UA Yellow Yellow  Appearance Ur Clear Clear   Leukocytes,UA Negative Negative   Protein,UA Negative Negative/Trace   Glucose, UA Negative Negative   Ketones, UA Negative Negative   RBC, UA Negative Negative   Bilirubin, UA Negative Negative   Urobilinogen, Ur 0.2 0.2 - 1.0 mg/dL   Nitrite, UA Negative Negative   Microscopic Examination Comment    Microscopic Examination See below:    Urinalysis Reflex Comment         Assessment & Plan:   Problem List Items Addressed This Visit   None Visit Diagnoses       Benign localized prostatic hyperplasia with lower urinary tract symptoms (LUTS)    -  Primary   Relevant Medications   tamsulosin  (FLOMAX ) 0.4 MG CAPS capsule   Other Relevant Orders   UA/M w/rflx Culture, Routine (Completed)     Hyperlipidemia, unspecified hyperlipidemia type       Relevant Medications   simvastatin  (ZOCOR ) 40 MG tablet       Meds ordered  this encounter  Medications   tamsulosin  (FLOMAX ) 0.4 MG CAPS capsule    Sig: Take 1 capsule (0.4 mg total) by mouth daily.    Dispense:  30 capsule    Refill:  3   simvastatin  (ZOCOR ) 40 MG tablet    Sig: Take 1 tablet (40 mg total) by mouth at bedtime. 1/2 tablet daily    Dispense:  90 tablet    Refill:  1    No follow-ups on file.  Jameson Tormey K Linnette Panella, MD   "

## 2024-09-30 ENCOUNTER — Ambulatory Visit: Payer: Self-pay | Admitting: Family Medicine

## 2024-09-30 LAB — UA/M W/RFLX CULTURE, ROUTINE
Bilirubin, UA: NEGATIVE
Glucose, UA: NEGATIVE
Ketones, UA: NEGATIVE
Leukocytes,UA: NEGATIVE
Nitrite, UA: NEGATIVE
Protein,UA: NEGATIVE
RBC, UA: NEGATIVE
Specific Gravity, UA: 1.009 (ref 1.005–1.030)
Urobilinogen, Ur: 0.2 mg/dL (ref 0.2–1.0)
pH, UA: 6.5 (ref 5.0–7.5)

## 2024-09-30 LAB — MICROSCOPIC EXAMINATION
Bacteria, UA: NONE SEEN
Casts: NONE SEEN /LPF
Epithelial Cells (non renal): NONE SEEN /HPF (ref 0–10)
RBC, Urine: NONE SEEN /HPF (ref 0–2)
WBC, UA: NONE SEEN /HPF (ref 0–5)

## 2024-09-30 NOTE — Telephone Encounter (Signed)
 Requested medication (s) are due for refill today: yes  Requested medication (s) are on the active medication list: yes  Last refill:  09/30/23  Future visit scheduled: yes  Notes to clinic:  Pharmacy comment: Script Clarification:SIG?      Requested Prescriptions  Pending Prescriptions Disp Refills   simvastatin  (ZOCOR ) 40 MG tablet [Pharmacy Med Name: SIMVASTATIN  40 MG TABLET] 90 tablet 1    Sig: Take 1 tablet (40 mg total) by mouth at bedtime. 1/2 tablet daily     Cardiovascular:  Antilipid - Statins Failed - 09/30/2024  1:25 PM      Failed - Lipid Panel in normal range within the last 12 months    Cholesterol, Total  Date Value Ref Range Status  03/03/2024 187 100 - 199 mg/dL Final   LDL Chol Calc (NIH)  Date Value Ref Range Status  03/03/2024 83 0 - 99 mg/dL Final   HDL  Date Value Ref Range Status  03/03/2024 40 >39 mg/dL Final   Triglycerides  Date Value Ref Range Status  03/03/2024 396 (H) 0 - 149 mg/dL Final         Passed - Patient is not pregnant      Passed - Valid encounter within last 12 months    Recent Outpatient Visits           Yesterday Benign localized prostatic hyperplasia with lower urinary tract symptoms (LUTS)   [place] [facility] at [facility] Kotturi, Vinay K, MD   4 weeks ago Encounter for long-term current use of medication   [facility] at [facility] Kotturi, Vinay K, MD   7 months ago Primary hypertension   [facility] at [facility], Vinay K, MD       Future Appointments             In 3 days Argentina Clap, MD [facility] at [place]   In 9 months Stoioff, Glendia BROCKS, MD [facility] [place]

## 2024-10-03 ENCOUNTER — Ambulatory Visit

## 2024-10-03 VITALS — BP 94/58 | Ht 68.0 in | Wt 205.0 lb

## 2024-10-03 DIAGNOSIS — R079 Chest pain, unspecified: Secondary | ICD-10-CM | POA: Diagnosis not present

## 2024-10-03 DIAGNOSIS — E782 Mixed hyperlipidemia: Secondary | ICD-10-CM | POA: Diagnosis not present

## 2024-10-03 DIAGNOSIS — Z8249 Family history of ischemic heart disease and other diseases of the circulatory system: Secondary | ICD-10-CM | POA: Diagnosis not present

## 2024-10-03 NOTE — Patient Instructions (Signed)
 Medication Instructions:   Your physician recommends that you continue on your current medications as directed. Please refer to the Current Medication list given to you today.   *If you need a refill on your cardiac medications before your next appointment, please call your pharmacy*  Lab Work:  No labs ordered today   If you have labs (blood work) drawn today and your tests are completely normal, you will receive your results only by: MyChart Message (if you have MyChart) OR A paper copy in the mail If you have any lab test that is abnormal or we need to change your treatment, we will call you to review the results.  Testing/Procedures:  ECHOCARDIOGRAM:  Your physician has requested that you have an echocardiogram. Echocardiography is a painless test that uses sound waves to create images of your heart. It provides your doctor with information about the size and shape of your heart and how well your hearts chambers and valves are working.   You may receive an ultrasound enhancing agent through an IV if needed to better visualize your heart during the echo. This procedure takes approximately one hour.  There are no restrictions for this procedure.  This will take place at [address] [address], [place] [address]   CARDIAC PET STRESS   Please report to Radiology at the [facility] Main Entrance 30 minutes early for your test.  [address] [place], [place] [address]                         OR   Please report to Radiology at [facility] Main Entrance, medical mall, 30 mins prior to your test.  [address]  [place], [place]  How to Prepare for Your Cardiac PET/CT Stress Test:  Nothing to eat or drink, except water, 3 hours prior to arrival time.  NO caffeine/decaffeinated products, or chocolate 12 hours prior to arrival. (Please note decaffeinated beverages (teas/coffees) still contain caffeine).  If you  have caffeine within 12 hours prior, the test will need to be rescheduled.  Medication instructions:  Do not take erectile dysfunction medications for 72 hours prior to test (sildenafil , tadalafil ) Do not take tamsulosin  the day before or morning of test  You may take your remaining medications with water.  NO perfume, cologne or lotion on chest or abdomen area.  Total time is 1 to 2 hours; you may want to bring reading material for the waiting time.  In preparation for your appointment, medication and supplies will be purchased.  Appointment availability is limited, so if you need to cancel or reschedule, please call the Radiology Department Scheduler at [phone] 24 hours in advance to avoid a cancellation fee of $100.00  What to Expect When you Arrive:  Once you arrive and check in for your appointment, you will be taken to a preparation room within the Radiology Department.  A technologist or Nurse will obtain your medical history, verify that you are correctly prepped for the exam, and explain the procedure.  Afterwards, an IV will be started in your arm and electrodes will be placed on your skin for EKG monitoring during the stress portion of the exam. Then you will be escorted to the PET/CT scanner.  There, staff will get you positioned on the scanner and obtain a blood pressure and EKG.  During the exam, you will continue to be connected to the EKG and blood pressure  machines.  A small, safe amount of a radioactive tracer will be injected in your IV to obtain a series of pictures of your heart along with an injection of a stress agent.    After your Exam:  It is recommended that you eat a meal and drink a caffeinated beverage to counter act any effects of the stress agent.  Drink plenty of fluids for the remainder of the day and urinate frequently for the first couple of hours after the exam.  Your doctor will inform you of your test results within 7-10 business days.  For more  information and frequently asked questions, please visit our website: [email or web address]  For questions about your test or how to prepare for your test, please call: Cardiac Imaging Nurse Navigators Office: [phone]     Follow-Up: At [facility], you and your health needs are our priority.  As part of our continuing mission to provide you with exceptional heart care, our providers are all part of one team.  This team includes your primary Cardiologist (physician) and Advanced Practice Providers or APPs (Physician Assistants and Nurse Practitioners) who all work together to provide you with the care you need, when you need it.  Your next appointment:   3 month(s)  Provider:   Caron Poser, MD    We recommend signing up for the patient portal called MyChart.  Sign up information is provided on this After Visit Summary.  MyChart is used to connect with patients for Virtual Visits (Telemedicine).  Patients are able to view lab/test results, encounter notes, upcoming appointments, etc.  Non-urgent messages can be sent to your provider as well.   To learn more about what you can do with MyChart, go to [email or web address].

## 2024-10-03 NOTE — Progress Notes (Signed)
" °  Cardiology Office Note   Date:  10/03/2024  ID:  Noah Ayala, DOB [birth year], MRN [id number] PCP: Kotturi, Vinay K, MD  [facility] Providers Cardiologist:  Caron Poser, MD     History of Present Illness Noah Ayala is a 79 y.o. male PMH HTN, HLD who presents for further evaluation management of chest discomfort.  Seen by PCP for this issue 09/02/2024.  Report of intermittent chest pressure and family history of CVD.  Last LDL 83 02/2024.  Patient notes that he has had intermittent chest tightness for quite some time now.  He has not noticed a pattern; happen randomly, sometimes at rest, time sometimes with exertion.  Denies any palpitations or syncope.  Denies any orthopnea or symptoms of heart failure.  Relevant CVD History -Normal stress echocardiogram 05/2022   ROS: Pt denies any chest discomfort, jaw pain, arm pain, palpitations, syncope, presyncope, orthopnea, PND, or LE edema.  Studies Reviewed I have independently reviewed the patient's ECG, previous cardiac testing, previous medical records, previous blood work.  Physical Exam VS:  BP (!) 94/58 (BP Location: Left Arm, Patient Position: Sitting, Cuff Size: Normal)   Ht 5' 8 (1.727 m)   Wt 205 lb (93 kg)   BMI 31.17 kg/m        Wt Readings from Last 3 Encounters:  10/03/24 205 lb (93 kg)  09/29/24 203 lb (92.1 kg)  09/02/24 209 lb (94.8 kg)    GEN: No acute distress. NECK: No JVD; No carotid bruits. CARDIAC: RRR, no murmurs, rubs, gallops. RESPIRATORY:  Clear to auscultation. EXTREMITIES:  Warm and well-perfused. No edema.  ASSESSMENT AND PLAN Chest discomfort Family history of ASCVD Patient presents with nonspecific chest discomfort.  He has a family history of ASCVD in his mother and father.  Given his age and risk factor profile, further evaluation is indicated.  Plan: - Stress PET to further stratify - Echocardiogram to rule out structural causes - Further plans pending results  HLD Last  LDL 83 02/2024.  LDL goal less than 100 for now.  If his PET scan shows any coronary calcium or evidence of CAD, then we will likely need to lower this to less than 70 and add ASA.  We can await test results first.  Plan: - Continue simvastatin  20 mg; as above, may need to add ASA and adjust dose based on PET results     Informed Consent   The risks [chest pain, shortness of breath, cardiac arrhythmias, dizziness, blood pressure fluctuations, myocardial infarction, stroke/transient ischemic attack, nausea, vomiting, allergic reaction, radiation exposure, metallic taste sensation and life-threatening complications (estimated to be 1 in 10,000)], benefits (risk stratification, diagnosing coronary artery disease, treatment guidance) and alternatives of a cardiac PET stress test were discussed in detail with [name] and he agrees to proceed.     Dispo: RTC 3 months or sooner PRN  Signed, Caron Poser, MD  "

## 2024-10-03 NOTE — Addendum Note (Signed)
 Addended by: HARL HERON DEL on: 10/03/2024 10:24 AM   Modules accepted: Orders

## 2024-10-11 ENCOUNTER — Other Ambulatory Visit

## 2024-10-18 ENCOUNTER — Ambulatory Visit

## 2024-10-20 ENCOUNTER — Ambulatory Visit

## 2024-10-20 ENCOUNTER — Ambulatory Visit: Payer: Self-pay

## 2024-10-20 DIAGNOSIS — R079 Chest pain, unspecified: Secondary | ICD-10-CM | POA: Insufficient documentation

## 2024-10-20 DIAGNOSIS — E782 Mixed hyperlipidemia: Secondary | ICD-10-CM | POA: Insufficient documentation

## 2024-10-20 DIAGNOSIS — E785 Hyperlipidemia, unspecified: Secondary | ICD-10-CM

## 2024-10-20 DIAGNOSIS — Z8249 Family history of ischemic heart disease and other diseases of the circulatory system: Secondary | ICD-10-CM | POA: Diagnosis present

## 2024-10-20 LAB — ECHOCARDIOGRAM COMPLETE
AR max vel: 3.27 cm2
AV Area VTI: 3.13 cm2
AV Area mean vel: 3.04 cm2
AV Mean grad: 5 mmHg
AV Peak grad: 9 mmHg
Ao pk vel: 1.5 m/s
Area-P 1/2: 3.17 cm2
P 1/2 time: 167 ms
S' Lateral: 2.77 cm

## 2024-10-25 ENCOUNTER — Encounter (HOSPITAL_COMMUNITY): Payer: Self-pay

## 2024-10-25 NOTE — Addendum Note (Signed)
 Addended by: ARGENTINA CLAP on: 10/25/2024 03:55 PM   Modules accepted: Orders

## 2024-10-27 ENCOUNTER — Ambulatory Visit: Admission: RE | Admit: 2024-10-27 | Source: Ambulatory Visit

## 2024-10-27 DIAGNOSIS — R079 Chest pain, unspecified: Secondary | ICD-10-CM

## 2024-10-27 DIAGNOSIS — Z8249 Family history of ischemic heart disease and other diseases of the circulatory system: Secondary | ICD-10-CM

## 2024-10-27 DIAGNOSIS — E782 Mixed hyperlipidemia: Secondary | ICD-10-CM

## 2024-10-27 LAB — NM PET CT CARDIAC PERFUSION MULTI W/ABSOLUTE BLOODFLOW
LV dias vol: 92 mL (ref 62–150)
LV sys vol: 29 mL
MBFR: 2.75
Nuc Rest EF: 67 %
Nuc Stress EF: 68 %
Peak HR: 83 {beats}/min
Rest HR: 60 {beats}/min
Rest MBF: 0.72 ml/g/min
Rest Nuclear Isotope Dose: 24.2 mCi
SRS: 0
SSS: 0
Stress MBF: 1.98 ml/g/min
Stress Nuclear Isotope Dose: 24.1 mCi
TID: 1.1

## 2024-10-27 MED ORDER — RUBIDIUM RB82 GENERATOR (RUBYFILL)
25.0000 | PACK | Freq: Once | INTRAVENOUS | Status: AC
  Administered 2024-10-27: 24.13 via INTRAVENOUS

## 2024-10-27 MED ORDER — REGADENOSON 0.4 MG/5ML IV SOLN
0.4000 mg | Freq: Once | INTRAVENOUS | Status: AC
  Administered 2024-10-27: 0.4 mg via INTRAVENOUS
  Filled 2024-10-27: qty 5

## 2024-10-27 MED ORDER — RUBIDIUM RB82 GENERATOR (RUBYFILL)
25.0000 | PACK | Freq: Once | INTRAVENOUS | Status: AC
  Administered 2024-10-27: 24.18 via INTRAVENOUS

## 2024-10-27 MED ORDER — REGADENOSON 0.4 MG/5ML IV SOLN
INTRAVENOUS | Status: AC
  Filled 2024-10-27: qty 5

## 2024-10-28 MED ORDER — ASPIRIN 81 MG PO TBEC: 81.0000 mg | DELAYED_RELEASE_TABLET | Freq: Every day | ORAL | Status: AC

## 2024-10-28 MED ORDER — SIMVASTATIN 40 MG PO TABS: 40.0000 mg | ORAL_TABLET | Freq: Every day | ORAL | 1 refills | Status: AC

## 2025-01-31 ENCOUNTER — Ambulatory Visit: Admitting: Family Medicine

## 2025-06-07 ENCOUNTER — Ambulatory Visit

## 2025-07-26 ENCOUNTER — Ambulatory Visit: Admitting: Urology
